# Patient Record
Sex: Female | Born: 1995 | Race: Black or African American | Hispanic: No | Marital: Single | Smoking: Former smoker
Health system: Southern US, Community
[De-identification: ages and names within clinical notes are randomized; demographics above are authoritative.]

## PROBLEM LIST (undated history)

## (undated) ENCOUNTER — Inpatient Hospital Stay (HOSPITAL_COMMUNITY): Payer: Self-pay

## (undated) DIAGNOSIS — F32A Depression, unspecified: Secondary | ICD-10-CM

## (undated) DIAGNOSIS — F419 Anxiety disorder, unspecified: Secondary | ICD-10-CM

## (undated) DIAGNOSIS — B009 Herpesviral infection, unspecified: Secondary | ICD-10-CM

## (undated) DIAGNOSIS — Z789 Other specified health status: Secondary | ICD-10-CM

## (undated) DIAGNOSIS — A749 Chlamydial infection, unspecified: Secondary | ICD-10-CM

## (undated) HISTORY — PX: MOUTH SURGERY: SHX715

---
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
## 2017-03-16 ENCOUNTER — Encounter (HOSPITAL_COMMUNITY): Payer: Self-pay

## 2017-03-16 ENCOUNTER — Emergency Department (HOSPITAL_COMMUNITY)
Admission: EM | Admit: 2017-03-16 | Discharge: 2017-03-16 | Disposition: A | Discharge status: Home / Self Care | Payer: Self-pay | Attending: Emergency Medicine | Admitting: Emergency Medicine

## 2017-03-16 DIAGNOSIS — Z5321 Procedure and treatment not carried out due to patient leaving prior to being seen by health care provider: Secondary | ICD-10-CM | POA: Insufficient documentation

## 2017-03-16 DIAGNOSIS — N938 Other specified abnormal uterine and vaginal bleeding: Secondary | ICD-10-CM | POA: Insufficient documentation

## 2017-03-16 NOTE — ED Notes (Addendum)
No answer when called for lab x2.

## 2017-03-16 NOTE — ED Triage Notes (Signed)
Patient reports of positive pregnancy test and started having vaginal spotting last night. Also reports of RLQ pain. LMP 01/29/17.

## 2017-03-17 ENCOUNTER — Emergency Department (HOSPITAL_COMMUNITY)
Admission: EM | Admit: 2017-03-17 | Discharge: 2017-03-17 | Discharge status: Against Medical Advice | Payer: Self-pay | Attending: Emergency Medicine | Admitting: Emergency Medicine

## 2017-03-17 ENCOUNTER — Inpatient Hospital Stay (HOSPITAL_COMMUNITY)
Admission: AD | Admit: 2017-03-17 | Discharge: 2017-03-17 | Disposition: A | Discharge status: Home / Self Care | Payer: Self-pay | Source: Ambulatory Visit | Attending: Family Medicine | Admitting: Family Medicine

## 2017-03-17 ENCOUNTER — Encounter (HOSPITAL_COMMUNITY): Payer: Self-pay

## 2017-03-17 ENCOUNTER — Inpatient Hospital Stay (HOSPITAL_COMMUNITY): Payer: Self-pay

## 2017-03-17 ENCOUNTER — Encounter (HOSPITAL_COMMUNITY): Payer: Self-pay | Admitting: *Deleted

## 2017-03-17 DIAGNOSIS — O26892 Other specified pregnancy related conditions, second trimester: Secondary | ICD-10-CM | POA: Insufficient documentation

## 2017-03-17 DIAGNOSIS — R1084 Generalized abdominal pain: Secondary | ICD-10-CM | POA: Insufficient documentation

## 2017-03-17 DIAGNOSIS — Z880 Allergy status to penicillin: Secondary | ICD-10-CM | POA: Insufficient documentation

## 2017-03-17 DIAGNOSIS — R102 Pelvic and perineal pain: Secondary | ICD-10-CM | POA: Insufficient documentation

## 2017-03-17 DIAGNOSIS — O26891 Other specified pregnancy related conditions, first trimester: Secondary | ICD-10-CM

## 2017-03-17 DIAGNOSIS — O26899 Other specified pregnancy related conditions, unspecified trimester: Secondary | ICD-10-CM

## 2017-03-17 DIAGNOSIS — N854 Malposition of uterus: Secondary | ICD-10-CM | POA: Insufficient documentation

## 2017-03-17 DIAGNOSIS — Z87891 Personal history of nicotine dependence: Secondary | ICD-10-CM | POA: Insufficient documentation

## 2017-03-17 DIAGNOSIS — Z9889 Other specified postprocedural states: Secondary | ICD-10-CM | POA: Insufficient documentation

## 2017-03-17 DIAGNOSIS — Z3A01 Less than 8 weeks gestation of pregnancy: Secondary | ICD-10-CM | POA: Insufficient documentation

## 2017-03-17 DIAGNOSIS — Z5321 Procedure and treatment not carried out due to patient leaving prior to being seen by health care provider: Secondary | ICD-10-CM | POA: Insufficient documentation

## 2017-03-17 DIAGNOSIS — O98811 Other maternal infectious and parasitic diseases complicating pregnancy, first trimester: Secondary | ICD-10-CM | POA: Insufficient documentation

## 2017-03-17 LAB — CBC
HEMATOCRIT: 39.7 % (ref 36.0–46.0)
HEMOGLOBIN: 13.4 g/dL (ref 12.0–15.0)
MCH: 30.9 pg (ref 26.0–34.0)
MCHC: 33.8 g/dL (ref 30.0–36.0)
MCV: 91.5 fL (ref 78.0–100.0)
Platelets: 336 10*3/uL (ref 150–400)
RBC: 4.34 MIL/uL (ref 3.87–5.11)
RDW: 12.5 % (ref 11.5–15.5)
WBC: 9.7 10*3/uL (ref 4.0–10.5)

## 2017-03-17 LAB — COMPREHENSIVE METABOLIC PANEL
ALBUMIN: 4 g/dL (ref 3.5–5.0)
ALT: 14 U/L (ref 14–54)
AST: 20 U/L (ref 15–41)
Alkaline Phosphatase: 40 U/L (ref 38–126)
Anion gap: 7 (ref 5–15)
BILIRUBIN TOTAL: 1.2 mg/dL (ref 0.3–1.2)
BUN: <5 mg/dL — ABNORMAL LOW (ref 6–20)
CHLORIDE: 108 mmol/L (ref 101–111)
CO2: 21 mmol/L — ABNORMAL LOW (ref 22–32)
Calcium: 9.4 mg/dL (ref 8.9–10.3)
Creatinine, Ser: 0.77 mg/dL (ref 0.44–1.00)
GFR calc Af Amer: >60 mL/min (ref >60)
Glucose, Bld: 75 mg/dL (ref 65–99)
POTASSIUM: 3.5 mmol/L (ref 3.5–5.1)
Sodium: 136 mmol/L (ref 135–145)
TOTAL PROTEIN: 7 g/dL (ref 6.5–8.1)

## 2017-03-17 LAB — URINALYSIS, ROUTINE W REFLEX MICROSCOPIC
Bilirubin Urine: NEGATIVE
Glucose, UA: NEGATIVE mg/dL
Hgb urine dipstick: NEGATIVE
Ketones, ur: NEGATIVE mg/dL
LEUKOCYTES UA: NEGATIVE
NITRITE: NEGATIVE
PH: 6 (ref 5.0–8.0)
Protein, ur: NEGATIVE mg/dL
SPECIFIC GRAVITY, URINE: 1.019 (ref 1.005–1.030)

## 2017-03-17 LAB — WET PREP, GENITAL
CLUE CELLS WET PREP: NONE SEEN
Sperm: NONE SEEN
TRICH WET PREP: NONE SEEN
YEAST WET PREP: NONE SEEN

## 2017-03-17 LAB — LIPASE, BLOOD: LIPASE: 35 U/L (ref 11–51)

## 2017-03-17 LAB — HCG, QUANTITATIVE, PREGNANCY: HCG, BETA CHAIN, QUANT, S: 38752 m[IU]/mL — AB (ref <5)

## 2017-03-17 LAB — I-STAT BETA HCG BLOOD, ED (MC, WL, AP ONLY): I-stat hCG, quantitative: >2000 m[IU]/mL — ABNORMAL HIGH (ref <5)

## 2017-03-17 MED ORDER — PRENATAL VITAMINS 0.8 MG PO TABS: 1.0000 | ORAL_TABLET | Freq: Every day | ORAL | 12 refills | Status: DC

## 2017-03-17 NOTE — ED Triage Notes (Signed)
Per Pt, Pt reports RLQ pain and spotting that started yesterday. Pt complains that she is pregnant, but unknown how many weeks. Denies other vaginal discharge or urinary symptoms.

## 2017-03-17 NOTE — MAU Note (Signed)
Pt reports she has been having right side pain and spotting since last pm.

## 2017-03-17 NOTE — MAU Provider Note (Signed)
Chief Complaint: Abdominal Pain and Vaginal Bleeding   First Provider Initiated Contact with Patient 03/17/17 1354        SUBJECTIVE HPI: Tina Beck is a 21 y.o. G2P0010 at 662w6d  who presents to maternity admissions reporting pain in the right lower quadrant.  Started yesterday. States had spotting yesterday but none today.  Was at [facility] ED and had blood drawn, but left AMA because the wait was too long. . She denies vaginal itching/burning, urinary symptoms, h/a, dizziness, n/v, or fever/chills.     Abdominal Pain  This is a new problem. The current episode started yesterday. The onset quality is gradual. The problem occurs intermittently. The problem has been unchanged. The pain is located in the RLQ. The pain is mild. The quality of the pain is cramping. The abdominal pain does not radiate. Pertinent negatives include no constipation, diarrhea, fever, headaches, myalgias, nausea or vomiting. Nothing aggravates the pain. The pain is relieved by nothing. She has tried nothing for the symptoms.  Vaginal Bleeding  The patient's primary symptoms include pelvic pain and vaginal discharge. The patient's pertinent negatives include no genital itching, genital lesions, genital odor or vaginal bleeding. This is a new problem. The current episode started yesterday. The problem occurs intermittently. The problem has been resolved. The pain is mild. The problem affects the right side. She is pregnant. Associated symptoms include abdominal pain. Pertinent negatives include no constipation, diarrhea, fever, headaches, nausea or vomiting.   RN note: Pt reports she has been having right side pain and spotting since last pm.  Per Pt, Pt reports RLQ pain and spotting that started yesterday. Pt complains that she is pregnant, but unknown how many weeks. Denies other vaginal discharge or urinary symptoms.   History reviewed. No pertinent past medical history. Past Surgical History:  Procedure Laterality  Date  . MOUTH SURGERY    . OPEN REDUCTION INTERNAL FIXATION (ORIF) METACARPAL Right 03/23/2016   Procedure: OPEN REDUCTION INTERNAL FIXATION OF RIGHT 5TH METACARPAL;  Surgeon: Dairl PonderMatthew Weingold, MD;  Location: [facility];  Service: Orthopedics;  Laterality: Right;   Social History   Social History  . Marital status: Single    Spouse name: N/A  . Number of children: N/A  . Years of education: N/A   Occupational History  . Not on file.   Social History Main Topics  . Smoking status: Former Games developermoker  . Smokeless tobacco: Never Used  . Alcohol use No  . Drug use: No  . Sexual activity: Not Currently   Other Topics Concern  . Not on file   Social History Narrative  . No narrative on file   No current facility-administered medications on file prior to encounter.    No current outpatient prescriptions on file prior to encounter.   Allergies  Allergen Reactions  . Penicillins Hives and Itching    Has patient had a PCN reaction causing immediate rash, facial/tongue/throat swelling, SOB or lightheadedness with hypotension: No Has patient had a PCN reaction causing severe rash involving mucus membranes or skin necrosis: No Has patient had a PCN reaction that required hospitalization: No Has patient had a PCN reaction occurring within the last 10 years: Yes If all of the above answers are "NO", then may proceed with Cephalosporin use.     I have reviewed patient's Past Medical Hx, Surgical Hx, Family Hx, Social Hx, medications and allergies.   ROS:  Review of Systems  Constitutional: Negative for fever.  Gastrointestinal: Positive for abdominal pain.  Negative for constipation, diarrhea, nausea and vomiting.  Genitourinary: Positive for pelvic pain, vaginal bleeding and vaginal discharge.  Musculoskeletal: Negative for myalgias.  Neurological: Negative for headaches.   Review of Systems  Other systems negative   Physical Exam  Physical Exam Patient Vitals for  the past 24 hrs:  BP Temp Pulse Resp SpO2  03/17/17 1316 119/69 98.2 F (36.8 C) 61 16 100 %   Constitutional: Well-developed, well-nourished female in no acute distress.  Cardiovascular: normal rate Respiratory: normal effort GI: Abd soft, non-tender. Pos BS x 4 MS: Extremities nontender, no edema, normal ROM Neurologic: Alert and oriented x 4.  GU: Neg CVAT.  PELVIC EXAM: Retained tampon was found in vagina                           Cervix long and closed                            Uterus retroverted, firm, nontender                           Adnexa nontender bilaterally   LAB RESULTS Results for orders placed or performed during the hospital encounter of 03/17/17 (from the past 24 hour(s))  Wet prep, genital     Status: Abnormal   Collection Time: 03/17/17  1:52 PM  Result Value Ref Range   Yeast Wet Prep HPF POC NONE SEEN NONE SEEN   Trich, Wet Prep NONE SEEN NONE SEEN   Clue Cells Wet Prep HPF POC NONE SEEN NONE SEEN   WBC, Wet Prep HPF POC FEW (A) NONE SEEN   Sperm NONE SEEN     Ref. Range 03/17/2017 10:40  HCG, Beta Chain, Quant, S Latest Ref Range: <5 mIU/mL 38,752 (H)      IMAGING US Ob Comp Less 14 Wks  Result Date: 03/17/2017 CLINICAL DATA:  Pelvic pain affecting first trimester of pregnancy RIGHT-sided pain with spotting since last night EXAM: OBSTETRIC <14 WK [place] AND TRANSVAGINAL OB US TECHNIQUE: Both transabdominal and transvaginal ultrasound examinations were performed for complete evaluation of the gestation as well as the maternal uterus, adnexal regions, and pelvic cul-de-sac. Transvaginal technique was performed to assess early pregnancy. COMPARISON:  None for this gestation FINDINGS: Intrauterine gestational sac: Present Yolk sac:  Present Embryo:  Present Cardiac Activity: Present Heart Rate: 99  bpm CRL:  3.1  mm   5 w   6 d                  [place] EDC: 11/11/2017 Subchorionic hemorrhage:  None identified Maternal uterus/adnexae: Retroverted uterus. LEFT ovary  normal size and morphology 2.8 x 1.5 x 1.2 cm. RIGHT ovary normal size and morphology, 2.4 x 1.2 x 1.3 cm. No adnexal masses or free pelvic fluid. IMPRESSION: Single live intrauterine gestation at 5 weeks 6 days EGA by crown-rump length. No acute abnormalities. Electronically Signed   By: Ulyses Southward M.D.   On: 03/17/2017 16:12   US Ob Transvaginal  Result Date: 03/17/2017 CLINICAL DATA:  Pelvic pain affecting first trimester of pregnancy RIGHT-sided pain with spotting since last night EXAM: OBSTETRIC <14 WK [place] AND TRANSVAGINAL OB US TECHNIQUE: Both transabdominal and transvaginal ultrasound examinations were performed for complete evaluation of the gestation as well as the maternal uterus, adnexal regions, and pelvic cul-de-sac. Transvaginal technique was performed to assess early pregnancy.  COMPARISON:  None for this gestation FINDINGS: Intrauterine gestational sac: Present Yolk sac:  Present Embryo:  Present Cardiac Activity: Present Heart Rate: 99  bpm CRL:  3.1  mm   5 w   6 d                  [place] EDC: 11/11/2017 Subchorionic hemorrhage:  None identified Maternal uterus/adnexae: Retroverted uterus. LEFT ovary normal size and morphology 2.8 x 1.5 x 1.2 cm. RIGHT ovary normal size and morphology, 2.4 x 1.2 x 1.3 cm. No adnexal masses or free pelvic fluid. IMPRESSION: Single live intrauterine gestation at 5 weeks 6 days EGA by crown-rump length. No acute abnormalities. Electronically Signed   By: Ulyses Southward M.D.   On: 03/17/2017 16:12   MAU Management/MDM: Ordered usual first trimester r/o ectopic labs.   Pelvic exam and cultures done Will check baseline Ultrasound to rule out ectopic.  This bleeding/pain can represent a normal pregnancy with bleeding, spontaneous abortion or even an ectopic which can be life-threatening.  The process as listed above helps to determine which of these is present. Reviewed [place] results Viable [redacted]w[redacted]d single IUP Recommend start prenatal care   ASSESSMENT 1. Pelvic pain  affecting pregnancy   2.    Live single intrauterine pregnancy at [redacted]w[redacted]d  PLAN Discharge home Recommend start prenatal care  Pt stable at time of discharge. Encouraged to return here or to other Urgent Care/ED if she develops worsening of symptoms, increase in pain, fever, or other concerning symptoms.    Wynelle Bourgeois CNM, MSN Certified Nurse-Midwife 03/17/2017  2:52 PM

## 2017-03-17 NOTE — Discharge Instructions (Signed)
First Trimester of Pregnancy The first trimester of pregnancy is from week 1 until the end of week 13 (months 1 through 3). A week after a sperm fertilizes an egg, the egg will implant on the wall of the uterus. This embryo will begin to develop into a baby. Genes from you and your partner will form the baby. The female genes will determine whether the baby will be a boy or a girl. At 6-8 weeks, the eyes and face will be formed, and the heartbeat can be seen on ultrasound. At the end of 12 weeks, all the baby's organs will be formed. Now that you are pregnant, you will want to do everything you can to have a healthy baby. Two of the most important things are to get good prenatal care and to follow your health care provider's instructions. Prenatal care is all the medical care you receive before the baby's birth. This care will help prevent, find, and treat any problems during the pregnancy and childbirth. Body changes during your first trimester Your body goes through many changes during pregnancy. The changes vary from woman to woman.  You may gain or lose a couple of pounds at first.  You may feel sick to your stomach (nauseous) and you may throw up (vomit). If the vomiting is uncontrollable, call your health care provider.  You may tire easily.  You may develop headaches that can be relieved by medicines. All medicines should be approved by your health care provider.  You may urinate more often. Painful urination may mean you have a bladder infection.  You may develop heartburn as a result of your pregnancy.  You may develop constipation because certain hormones are causing the muscles that push stool through your intestines to slow down.  You may develop hemorrhoids or swollen veins (varicose veins).  Your breasts may begin to grow larger and become tender. Your nipples may stick out more, and the tissue that surrounds them (areola) may become darker.  Your gums may bleed and may be  sensitive to brushing and flossing.  Dark spots or blotches (chloasma, mask of pregnancy) may develop on your face. This will likely fade after the baby is born.  Your menstrual periods will stop.  You may have a loss of appetite.  You may develop cravings for certain kinds of food.  You may have changes in your emotions from day to day, such as being excited to be pregnant or being concerned that something may go wrong with the pregnancy and baby.  You may have more vivid and strange dreams.  You may have changes in your hair. These can include thickening of your hair, rapid growth, and changes in texture. Some women also have hair loss during or after pregnancy, or hair that feels dry or thin. Your hair will most likely return to normal after your baby is born.  What to expect at prenatal visits During a routine prenatal visit:  You will be weighed to make sure you and the baby are growing normally.  Your blood pressure will be taken.  Your abdomen will be measured to track your baby's growth.  The fetal heartbeat will be listened to between weeks 10 and 14 of your pregnancy.  Test results from any previous visits will be discussed.  Your health care provider may ask you:  How you are feeling.  If you are feeling the baby move.  If you have had any abnormal symptoms, such as leaking fluid, bleeding, severe headaches,   or abdominal cramping.  If you are using any tobacco products, including cigarettes, chewing tobacco, and electronic cigarettes.  If you have any questions.  Other tests that may be performed during your first trimester include:  Blood tests to find your blood type and to check for the presence of any previous infections. The tests will also be used to check for low iron levels (anemia) and protein on red blood cells (Rh antibodies). Depending on your risk factors, or if you previously had diabetes during pregnancy, you may have tests to check for high blood  sugar that affects pregnant women (gestational diabetes).  Urine tests to check for infections, diabetes, or protein in the urine.  An ultrasound to confirm the proper growth and development of the baby.  Fetal screens for spinal cord problems (spina bifida) and Down syndrome.  HIV (human immunodeficiency virus) testing. Routine prenatal testing includes screening for HIV, unless you choose not to have this test.  You may need other tests to make sure you and the baby are doing well.  Follow these instructions at home: Medicines  Follow your health care provider's instructions regarding medicine use. Specific medicines may be either safe or unsafe to take during pregnancy.  Take a prenatal vitamin that contains at least 600 micrograms (mcg) of folic acid.  If you develop constipation, try taking a stool softener if your health care provider approves. Eating and drinking  Eat a balanced diet that includes fresh fruits and vegetables, whole grains, good sources of protein such as meat, eggs, or tofu, and low-fat dairy. Your health care provider will help you determine the amount of weight gain that is right for you.  Avoid raw meat and uncooked cheese. These carry germs that can cause birth defects in the baby.  Eating four or five small meals rather than three large meals a day may help relieve nausea and vomiting. If you start to feel nauseous, eating a few soda crackers can be helpful. Drinking liquids between meals, instead of during meals, also seems to help ease nausea and vomiting.  Limit foods that are high in fat and processed sugars, such as fried and sweet foods.  To prevent constipation: ? Eat foods that are high in fiber, such as fresh fruits and vegetables, whole grains, and beans. ? Drink enough fluid to keep your urine clear or pale yellow. Activity  Exercise only as directed by your health care provider. Most women can continue their usual exercise routine during  pregnancy. Try to exercise for 30 minutes at least 5 days a week. Exercising will help you: ? Control your weight. ? Stay in shape. ? Be prepared for labor and delivery.  Experiencing pain or cramping in the lower abdomen or lower back is a good sign that you should stop exercising. Check with your health care provider before continuing with normal exercises.  Try to avoid standing for long periods of time. Move your legs often if you must stand in one place for a long time.  Avoid heavy lifting.  Wear low-heeled shoes and practice good posture.  You may continue to have sex unless your health care provider tells you not to. Relieving pain and discomfort  Wear a good support bra to relieve breast tenderness.  Take warm sitz baths to soothe any pain or discomfort caused by hemorrhoids. Use hemorrhoid cream if your health care provider approves.  Rest with your legs elevated if you have leg cramps or low back pain.  If you develop   varicose veins in your legs, wear support hose. Elevate your feet for 15 minutes, 3-4 times a day. Limit salt in your diet. Prenatal care  Schedule your prenatal visits by the twelfth week of pregnancy. They are usually scheduled monthly at first, then more often in the last 2 months before delivery.  Write down your questions. Take them to your prenatal visits.  Keep all your prenatal visits as told by your health care provider. This is important. Safety  Wear your seat belt at all times when driving.  Make a list of emergency phone numbers, including numbers for family, friends, the hospital, and police and fire departments. General instructions  Ask your health care provider for a referral to a local prenatal education class. Begin classes no later than the beginning of month 6 of your pregnancy.  Ask for help if you have counseling or nutritional needs during pregnancy. Your health care provider can offer advice or refer you to specialists for help  with various needs.  Do not use hot tubs, steam rooms, or saunas.  Do not douche or use tampons or scented sanitary pads.  Do not cross your legs for long periods of time.  Avoid cat litter boxes and soil used by cats. These carry germs that can cause birth defects in the baby and possibly loss of the fetus by miscarriage or stillbirth.  Avoid all smoking, herbs, alcohol, and medicines not prescribed by your health care provider. Chemicals in these products affect the formation and growth of the baby.  Do not use any products that contain nicotine or tobacco, such as cigarettes and e-cigarettes. If you need help quitting, ask your health care provider. You may receive counseling support and other resources to help you quit.  Schedule a dentist appointment. At home, brush your teeth with a soft toothbrush and be gentle when you floss. Contact a health care provider if:  You have dizziness.  You have mild pelvic cramps, pelvic pressure, or nagging pain in the abdominal area.  You have persistent nausea, vomiting, or diarrhea.  You have a bad smelling vaginal discharge.  You have pain when you urinate.  You notice increased swelling in your face, hands, legs, or ankles.  You are exposed to fifth disease or chickenpox.  You are exposed to German measles (rubella) and have never had it. Get help right away if:  You have a fever.  You are leaking fluid from your vagina.  You have spotting or bleeding from your vagina.  You have severe abdominal cramping or pain.  You have rapid weight gain or loss.  You vomit blood or material that looks like coffee grounds.  You develop a severe headache.  You have shortness of breath.  You have any kind of trauma, such as from a fall or a car accident. Summary  The first trimester of pregnancy is from week 1 until the end of week 13 (months 1 through 3).  Your body goes through many changes during pregnancy. The changes vary from  woman to woman.  You will have routine prenatal visits. During those visits, your health care provider will examine you, discuss any test results you may have, and talk with you about how you are feeling. This information is not intended to replace advice given to you by your health care provider. Make sure you discuss any questions you have with your health care provider. Document Released: 06/16/2001 Document Revised: 06/03/2016 Document Reviewed: 06/03/2016 Elsevier Interactive Patient Education  2017 [employer]  

## 2017-03-18 LAB — GC/CHLAMYDIA PROBE AMP (~~LOC~~) NOT AT ARMC
Chlamydia: POSITIVE — AB
NEISSERIA GONORRHEA: NEGATIVE

## 2017-03-19 ENCOUNTER — Telehealth: Payer: Self-pay | Admitting: Student

## 2017-03-19 DIAGNOSIS — A749 Chlamydial infection, unspecified: Secondary | ICD-10-CM

## 2017-03-19 MED ORDER — AZITHROMYCIN 500 MG PO TABS: 1000.0000 mg | ORAL_TABLET | Freq: Once | ORAL | 0 refills | Status: AC

## 2017-03-19 NOTE — Telephone Encounter (Addendum)
Idamay K Vullo tested positive for  Chlamydia. Patient was called by RN and allergies and pharmacy confirmed. Rx sent to pharmacy of choice.   Judeth Horn, NP 03/19/2017 1:16 PM       ----- Message from Kathe Becton, RN sent at 03/19/2017  1:12 PM EDT ----- This patient tested positive for "Chlamydia","  She ","is allergic to Penicillin"  I have informed the patient of her results and confirmed her pharmacy is correct in her chart. Please send Rx.   Thank you,   Kathe Becton, RN   Results faxed to [facility] Department.

## 2017-03-22 ENCOUNTER — Encounter: Payer: Self-pay | Admitting: Advanced Practice Midwife

## 2017-03-22 DIAGNOSIS — A749 Chlamydial infection, unspecified: Secondary | ICD-10-CM | POA: Insufficient documentation

## 2017-03-31 ENCOUNTER — Encounter: Payer: Self-pay | Admitting: Women's Health

## 2018-02-03 ENCOUNTER — Encounter (HOSPITAL_COMMUNITY): Payer: Self-pay

## 2018-04-12 ENCOUNTER — Emergency Department (HOSPITAL_COMMUNITY): Payer: Medicaid - Out of State

## 2018-04-12 ENCOUNTER — Encounter (HOSPITAL_COMMUNITY): Payer: Self-pay | Admitting: Emergency Medicine

## 2018-04-12 ENCOUNTER — Other Ambulatory Visit: Payer: Self-pay

## 2018-04-12 ENCOUNTER — Emergency Department (HOSPITAL_COMMUNITY)
Admission: EM | Admit: 2018-04-12 | Discharge: 2018-04-12 | Disposition: A | Discharge status: Home / Self Care | Payer: Medicaid - Out of State | Attending: Emergency Medicine | Admitting: Emergency Medicine

## 2018-04-12 DIAGNOSIS — Z87891 Personal history of nicotine dependence: Secondary | ICD-10-CM | POA: Insufficient documentation

## 2018-04-12 DIAGNOSIS — Z79899 Other long term (current) drug therapy: Secondary | ICD-10-CM | POA: Diagnosis not present

## 2018-04-12 DIAGNOSIS — N72 Inflammatory disease of cervix uteri: Secondary | ICD-10-CM | POA: Diagnosis not present

## 2018-04-12 DIAGNOSIS — O219 Vomiting of pregnancy, unspecified: Secondary | ICD-10-CM | POA: Diagnosis present

## 2018-04-12 DIAGNOSIS — Z3A01 Less than 8 weeks gestation of pregnancy: Secondary | ICD-10-CM | POA: Diagnosis not present

## 2018-04-12 DIAGNOSIS — B373 Candidiasis of vulva and vagina: Secondary | ICD-10-CM | POA: Insufficient documentation

## 2018-04-12 DIAGNOSIS — O23511 Infections of cervix in pregnancy, first trimester: Secondary | ICD-10-CM | POA: Insufficient documentation

## 2018-04-12 DIAGNOSIS — B3731 Acute candidiasis of vulva and vagina: Secondary | ICD-10-CM

## 2018-04-12 DIAGNOSIS — A5901 Trichomonal vulvovaginitis: Secondary | ICD-10-CM

## 2018-04-12 DIAGNOSIS — E876 Hypokalemia: Secondary | ICD-10-CM | POA: Insufficient documentation

## 2018-04-12 LAB — BASIC METABOLIC PANEL
ANION GAP: 12 (ref 5–15)
BUN: 9 mg/dL (ref 6–20)
CALCIUM: 9.1 mg/dL (ref 8.9–10.3)
CO2: 19 mmol/L — ABNORMAL LOW (ref 22–32)
CREATININE: 0.68 mg/dL (ref 0.44–1.00)
Chloride: 104 mmol/L (ref 98–111)
Glucose, Bld: 75 mg/dL (ref 70–99)
Potassium: 3 mmol/L — ABNORMAL LOW (ref 3.5–5.1)
Sodium: 135 mmol/L (ref 135–145)

## 2018-04-12 LAB — URINALYSIS, ROUTINE W REFLEX MICROSCOPIC
BILIRUBIN URINE: NEGATIVE
Glucose, UA: NEGATIVE mg/dL
Hgb urine dipstick: NEGATIVE
Ketones, ur: 80 mg/dL — AB
NITRITE: NEGATIVE
PROTEIN: 100 mg/dL — AB
SPECIFIC GRAVITY, URINE: 1.027 (ref 1.005–1.030)
pH: 5 (ref 5.0–8.0)

## 2018-04-12 LAB — CBC WITH DIFFERENTIAL/PLATELET
Abs Immature Granulocytes: 0.02 10*3/uL (ref 0.00–0.07)
BASOS ABS: 0 10*3/uL (ref 0.0–0.1)
Basophils Relative: 0 %
EOS ABS: 0 10*3/uL (ref 0.0–0.5)
EOS PCT: 0 %
HCT: 41.8 % (ref 36.0–46.0)
Hemoglobin: 13.5 g/dL (ref 12.0–15.0)
IMMATURE GRANULOCYTES: 0 %
LYMPHS ABS: 2 10*3/uL (ref 0.7–4.0)
Lymphocytes Relative: 20 %
MCH: 30.3 pg (ref 26.0–34.0)
MCHC: 32.3 g/dL (ref 30.0–36.0)
MCV: 93.9 fL (ref 80.0–100.0)
MONOS PCT: 7 %
Monocytes Absolute: 0.8 10*3/uL (ref 0.1–1.0)
NEUTROS PCT: 73 %
NRBC: 0 % (ref 0.0–0.2)
Neutro Abs: 7.6 10*3/uL (ref 1.7–7.7)
PLATELETS: 384 10*3/uL (ref 150–400)
RBC: 4.45 MIL/uL (ref 3.87–5.11)
RDW: 13.1 % (ref 11.5–15.5)
WBC: 10.4 10*3/uL (ref 4.0–10.5)

## 2018-04-12 LAB — WET PREP, GENITAL
CLUE CELLS WET PREP: NONE SEEN
Sperm: NONE SEEN

## 2018-04-12 LAB — HCG, QUANTITATIVE, PREGNANCY: HCG, BETA CHAIN, QUANT, S: 56394 m[IU]/mL — AB (ref <5)

## 2018-04-12 MED ORDER — ONDANSETRON 4 MG PO TBDP: 4.0000 mg | ORAL_TABLET | Freq: Three times a day (TID) | ORAL | 0 refills | Status: DC | PRN

## 2018-04-12 MED ORDER — METRONIDAZOLE 500 MG PO TABS
2000.0000 mg | ORAL_TABLET | Freq: Once | ORAL | Status: DC
  Filled 2018-04-12: qty 4

## 2018-04-12 MED ORDER — AZITHROMYCIN 250 MG PO TABS
500.0000 mg | ORAL_TABLET | Freq: Once | ORAL | Status: DC
  Filled 2018-04-12: qty 2

## 2018-04-12 MED ORDER — MICONAZOLE NITRATE 1200 & 2 MG & % VA KIT: 1.0000 | PACK | Freq: Once | VAGINAL | 0 refills | Status: AC

## 2018-04-12 MED ORDER — PRENATAL VITAMINS 0.8 MG PO TABS: 1.0000 | ORAL_TABLET | Freq: Every day | ORAL | 0 refills | Status: AC

## 2018-04-12 MED ORDER — SODIUM CHLORIDE 0.9 % IV BOLUS
1000.0000 mL | Freq: Once | INTRAVENOUS | Status: AC
  Administered 2018-04-12: 1000 mL via INTRAVENOUS

## 2018-04-12 MED ORDER — LIDOCAINE HCL (PF) 1 % IJ SOLN
INTRAMUSCULAR | Status: AC
  Filled 2018-04-12: qty 2

## 2018-04-12 MED ORDER — CEFTRIAXONE SODIUM 250 MG IJ SOLR
250.0000 mg | Freq: Once | INTRAMUSCULAR | Status: DC
  Filled 2018-04-12: qty 250

## 2018-04-12 MED ORDER — ONDANSETRON HCL 4 MG/2ML IJ SOLN
4.0000 mg | Freq: Once | INTRAMUSCULAR | Status: AC
  Administered 2018-04-12: 4 mg via INTRAVENOUS
  Filled 2018-04-12: qty 2

## 2018-04-12 NOTE — ED Notes (Signed)
Pt pulled out her IV, Got pt to wait and sign out, Would not wait for EDP to come to room to discuss her leaving.

## 2018-04-12 NOTE — ED Triage Notes (Signed)
Pt is [redacted] week pregnant.  States having n/v 3 days and unable to eat.

## 2018-04-12 NOTE — ED Notes (Signed)
Brought meds in room for Patient, patient ask " will this medication cause and miscarriage/"  Told pt medication was treatment of her trich. Pt states " I am refusing the medication" Advised pt to take medication so the baby would not have any complications. " I want to have a miscarriage, I will go get an abortion, I am not taking taking that medicine." Pt signed out AMA

## 2018-04-12 NOTE — ED Provider Notes (Signed)
[facility] EMERGENCY DEPARTMENT Provider Note   CSN: [id number] Arrival date & time: 04/12/18  1506     History   Chief Complaint Chief Complaint  Patient presents with  . Emesis During Pregnancy    Tina Beck is a 22 y.o. female.  Pt presents to the ED today with vomiting.  She thinks she is about [redacted] weeks pregnant.  She took 2 pregnancy tests that were + at home.  She has not yet seen a doctor.  She has had some spotting and some lower abdominal pain.  The pt said she has not eaten in 3 days.  She denies f/c.  She said the same thing happened when she was pregnant with her last pregnancy.  Baby was born in May.     History reviewed. No pertinent past medical history.  Patient Active Problem List   Diagnosis Date Noted  . Chlamydia 03/22/2017  . Missed abortion 10/14/2015    Past Surgical History:  Procedure Laterality Date  . MOUTH SURGERY    . OPEN REDUCTION INTERNAL FIXATION (ORIF) METACARPAL Right 03/23/2016   Procedure: OPEN REDUCTION INTERNAL FIXATION OF RIGHT 5TH METACARPAL;  Surgeon: Charlotte Crumb, MD;  Location: [place];  Service: Orthopedics;  Laterality: Right;     OB History    Gravida  3   Para      Term      Preterm      AB  1   Living        SAB  1   TAB      Ectopic      Multiple      Live Births               Home Medications    Prior to Admission medications   Medication Sig Start Date End Date Taking? Authorizing Provider  miconazole (MONISTAT 1 COMBO PACK) kit Place 1 each vaginally once for 1 dose. 04/12/18 04/12/18  Isla Pence, MD  ondansetron (ZOFRAN ODT) 4 MG disintegrating tablet Take 1 tablet (4 mg total) by mouth every 8 (eight) hours as needed. 04/12/18   Isla Pence, MD  Prenatal Multivit-Min-Fe-FA (PRENATAL VITAMINS) 0.8 MG tablet Take 1 tablet by mouth daily. 04/12/18   Isla Pence, MD    Family History No family history on file.  Social History Social History    Tobacco Use  . Smoking status: Former Research scientist (life sciences)  . Smokeless tobacco: Never Used  Substance Use Topics  . Alcohol use: No  . Drug use: No     Allergies   Penicillins   Review of Systems Review of Systems  Gastrointestinal: Positive for abdominal pain, nausea and vomiting.  Genitourinary: Positive for vaginal bleeding.  All other systems reviewed and are negative.    Physical Exam Updated Vital Signs BP 120/85 (BP Location: Right Arm)   Pulse 71   Temp 98 F (36.7 C) (Oral)   Resp 18   Ht 6' (1.829 m)   Wt 72.6 kg   LMP 03/06/2018   SpO2 99%   BMI 21.70 kg/m   Physical Exam  Constitutional: She is oriented to person, place, and time. She appears well-developed and well-nourished.  HENT:  Head: Normocephalic and atraumatic.  Right Ear: External ear normal.  Left Ear: External ear normal.  Nose: Nose normal.  Mouth/Throat: Mucous membranes are dry.  Eyes: Pupils are equal, round, and reactive to light. Conjunctivae and EOM are normal.  Neck: Normal range of motion. Neck supple.  Cardiovascular: Normal rate, regular rhythm, normal heart sounds and intact distal pulses.  Pulmonary/Chest: Effort normal and breath sounds normal.  Abdominal: Soft. Bowel sounds are normal. There is tenderness in the suprapubic area.  Genitourinary: Vagina normal and uterus normal. Cervix exhibits discharge and friability.  Musculoskeletal: Normal range of motion.  Neurological: She is alert and oriented to person, place, and time.  Skin: Skin is warm. Capillary refill takes less than 2 seconds.  Psychiatric: She has a normal mood and affect. Her behavior is normal. Judgment and thought content normal.  Nursing note and vitals reviewed.    ED Treatments / Results  Labs (all labs ordered are listed, but only abnormal results are displayed) Labs Reviewed  WET PREP, GENITAL - Abnormal; Notable for the following components:      Result Value   Yeast Wet Prep HPF POC PRESENT (*)     Trich, Wet Prep PRESENT (*)    WBC, Wet Prep HPF POC MANY (*)    All other components within normal limits  BASIC METABOLIC PANEL - Abnormal; Notable for the following components:   Potassium 3.0 (*)    CO2 19 (*)    All other components within normal limits  URINALYSIS, ROUTINE W REFLEX MICROSCOPIC - Abnormal; Notable for the following components:   APPearance CLOUDY (*)    Ketones, ur 80 (*)    Protein, ur 100 (*)    Leukocytes, UA LARGE (*)    Bacteria, UA FEW (*)    Trichomonas, UA PRESENT (*)    All other components within normal limits  HCG, QUANTITATIVE, PREGNANCY - Abnormal; Notable for the following components:   hCG, Beta Chain, Quant, S 56,394 (*)    All other components within normal limits  CBC WITH DIFFERENTIAL/PLATELET  GC/CHLAMYDIA PROBE AMP ([place]) NOT AT [facility]    EKG None  Radiology US Ob Comp < 14 Wks  Result Date: 04/12/2018 CLINICAL DATA:  Nausea with pregnancy. EXAM: OBSTETRIC <14 WK ULTRASOUND TECHNIQUE: Transabdominal ultrasound was performed for evaluation of the gestation as well as the maternal uterus and adnexal regions. COMPARISON:  Prior ultrasound 09/30/2017 FINDINGS: Intrauterine gestational sac: Single Yolk sac:  Present Embryo:  Present Cardiac Activity: Present Heart Rate: 169 bpm CRL: 5.4 mm   6 w 2 d                  [place] EDC: 12/04/2018 Subchorionic hemorrhage:  None visualized. Maternal uterus/adnexae: The right ovary was not seen. Normal appearance of the left ovary. IMPRESSION: Single live intrauterine pregnancy corresponding to 6 weeks and 2 days gestation. Electronically Signed   By: Fidela Salisbury M.D.   On: 04/12/2018 17:13    Procedures Procedures (including critical care time)  Medications Ordered in ED Medications  metroNIDAZOLE (FLAGYL) tablet 2,000 mg (has no administration in time range)  cefTRIAXone (ROCEPHIN) injection 250 mg (has no administration in time range)  azithromycin (ZITHROMAX) tablet 500 mg (has no  administration in time range)  sodium chloride 0.9 % bolus 1,000 mL (0 mLs Intravenous Stopped 04/12/18 1727)  ondansetron (ZOFRAN) injection 4 mg (4 mg Intravenous Given 04/12/18 1626)     Initial Impression / Assessment and Plan / ED Course  I have reviewed the triage vital signs and the nursing notes.  Pertinent labs & imaging results that were available during my care of the patient were reviewed by me and considered in my medical decision making (see chart for details).    Pt is feeling much better after  IVFs and zofran.  US shows IUP.  Bleeding likely from friable cervix.  She has trichomonas and yeast as well as many WBCs.  I will treat her with rocephin/zithromax/flagyl.  She is instructed to monistat for her yeast infection as diflucan is not recommended in pregnancy.  Sexual partners need to be treated.  She is instructed to f/u with her obgyn.  Return if worse.    Final Clinical Impressions(s) / ED Diagnoses   Final diagnoses:  Cervicitis  Trichomoniasis of vagina  Vaginal candidiasis  Less than [redacted] weeks gestation of pregnancy  Hypokalemia    ED Discharge Orders         Ordered    Prenatal Multivit-Min-Fe-FA (PRENATAL VITAMINS) 0.8 MG tablet  Daily     04/12/18 1814    ondansetron (ZOFRAN ODT) 4 MG disintegrating tablet  Every 8 hours PRN     04/12/18 1814    miconazole (MONISTAT 1 COMBO PACK) kit   Once     04/12/18 1814           Isla Pence, MD 04/12/18 1816

## 2018-04-12 NOTE — ED Notes (Signed)
Going for u/S

## 2018-04-12 NOTE — ED Notes (Signed)
Pt and boyfriend in an argument of test results, yelling very loud, calling security, Boyfriend left department without problems.

## 2018-04-13 LAB — GC/CHLAMYDIA PROBE AMP (~~LOC~~) NOT AT ARMC
Chlamydia: NEGATIVE
Neisseria Gonorrhea: POSITIVE — AB

## 2018-04-15 ENCOUNTER — Encounter (HOSPITAL_COMMUNITY): Payer: Self-pay

## 2018-04-15 ENCOUNTER — Emergency Department (HOSPITAL_COMMUNITY)
Admission: EM | Admit: 2018-04-15 | Discharge: 2018-04-16 | Disposition: A | Discharge status: Home / Self Care | Payer: Medicaid - Out of State | Attending: Emergency Medicine | Admitting: Emergency Medicine

## 2018-04-15 DIAGNOSIS — O2390 Unspecified genitourinary tract infection in pregnancy, unspecified trimester: Secondary | ICD-10-CM | POA: Diagnosis not present

## 2018-04-15 DIAGNOSIS — A5901 Trichomonal vulvovaginitis: Secondary | ICD-10-CM | POA: Diagnosis not present

## 2018-04-15 DIAGNOSIS — A599 Trichomoniasis, unspecified: Secondary | ICD-10-CM

## 2018-04-15 DIAGNOSIS — Z87891 Personal history of nicotine dependence: Secondary | ICD-10-CM | POA: Diagnosis not present

## 2018-04-15 DIAGNOSIS — O98319 Other infections with a predominantly sexual mode of transmission complicating pregnancy, unspecified trimester: Secondary | ICD-10-CM | POA: Diagnosis present

## 2018-04-15 DIAGNOSIS — Z3A00 Weeks of gestation of pregnancy not specified: Secondary | ICD-10-CM | POA: Insufficient documentation

## 2018-04-15 DIAGNOSIS — Z3490 Encounter for supervision of normal pregnancy, unspecified, unspecified trimester: Secondary | ICD-10-CM

## 2018-04-15 NOTE — ED Triage Notes (Signed)
Pt states she was here on the 8th and told she has Trichomonas, states she did not stay to get treated and left bc she was mad at someone (not staff)

## 2018-04-16 MED ORDER — AZITHROMYCIN 250 MG PO TABS
1000.0000 mg | ORAL_TABLET | Freq: Once | ORAL | Status: AC
  Administered 2018-04-16: 1000 mg via ORAL
  Filled 2018-04-16: qty 4

## 2018-04-16 MED ORDER — STERILE WATER FOR INJECTION IJ SOLN
INTRAMUSCULAR | Status: AC
  Administered 2018-04-16: 10 mL
  Filled 2018-04-16: qty 10

## 2018-04-16 MED ORDER — ONDANSETRON HCL 4 MG PO TABS
4.0000 mg | ORAL_TABLET | Freq: Once | ORAL | Status: AC
  Administered 2018-04-16: 4 mg via ORAL
  Filled 2018-04-16: qty 1

## 2018-04-16 MED ORDER — CEFTRIAXONE SODIUM 250 MG IJ SOLR
250.0000 mg | Freq: Once | INTRAMUSCULAR | Status: AC
  Administered 2018-04-16: 250 mg via INTRAMUSCULAR
  Filled 2018-04-16: qty 250

## 2018-04-16 MED ORDER — METRONIDAZOLE 500 MG PO TABS
2000.0000 mg | ORAL_TABLET | Freq: Once | ORAL | Status: AC
  Administered 2018-04-16: 2000 mg via ORAL
  Filled 2018-04-16: qty 4

## 2018-04-16 NOTE — ED Provider Notes (Signed)
[facility] EMERGENCY DEPARTMENT Provider Note   CSN: [id number] Arrival date & time: 04/15/18  2302     History   Chief Complaint Chief Complaint  Patient presents with  . S74.5    HPI Tina Beck is a 22 y.o. female.  Patient is a 22 year old female who presents to the emergency department for completion of her treatment and management of an STD.  The patient was seen in the emergency department October 8.  Patient was diagnosed with an STD.  She was also found to be pregnant.  The patient left the emergency department AGAINST MEDICAL ADVICE before the treatment could be given.  The patient states she returns tonight to get the treatment that she was supposed to get on October 8.  The history is provided by the patient.    History reviewed. No pertinent past medical history.  Patient Active Problem List   Diagnosis Date Noted  . Chlamydia 03/22/2017  . Missed abortion 10/14/2015    Past Surgical History:  Procedure Laterality Date  . MOUTH SURGERY    . OPEN REDUCTION INTERNAL FIXATION (ORIF) METACARPAL Right 03/23/2016   Procedure: OPEN REDUCTION INTERNAL FIXATION OF RIGHT 5TH METACARPAL;  Surgeon: Dairl Ponder, MD;  Location: [facility];  Service: Orthopedics;  Laterality: Right;     OB History    Gravida  3   Para      Term      Preterm      AB  1   Living        SAB  1   TAB      Ectopic      Multiple      Live Births               Home Medications    Prior to Admission medications   Medication Sig Start Date End Date Taking? Authorizing Provider  ondansetron (ZOFRAN ODT) 4 MG disintegrating tablet Take 1 tablet (4 mg total) by mouth every 8 (eight) hours as needed. 04/12/18   Jacalyn Lefevre, MD  Prenatal Multivit-Min-Fe-FA (PRENATAL VITAMINS) 0.8 MG tablet Take 1 tablet by mouth daily. 04/12/18   Jacalyn Lefevre, MD    Family History No family history on file.  Social History Social History   Tobacco  Use  . Smoking status: Former Games developer  . Smokeless tobacco: Never Used  Substance Use Topics  . Alcohol use: No  . Drug use: No     Allergies   Penicillins   Review of Systems Review of Systems  Constitutional: Negative for activity change, chills and fever.       All ROS Neg except as noted in HPI  HENT: Negative for nosebleeds.   Eyes: Negative for photophobia and discharge.  Respiratory: Negative for cough, shortness of breath and wheezing.   Cardiovascular: Negative for chest pain and palpitations.  Gastrointestinal: Positive for abdominal pain. Negative for blood in stool.  Genitourinary: Positive for vaginal discharge. Negative for dysuria, frequency and hematuria.  Musculoskeletal: Negative for arthralgias, back pain and neck pain.  Skin: Negative.   Neurological: Negative for dizziness, seizures and speech difficulty.  Psychiatric/Behavioral: Negative for confusion and hallucinations.     Physical Exam Updated Vital Signs BP 108/60 (BP Location: Right Arm)   Pulse 78   Temp 99 F (37.2 C) (Oral)   Resp 15   Ht 5\' 11"  (1.803 m)   Wt 72.6 kg   LMP 03/06/2018   SpO2 98%   BMI 22.32 kg/m  Physical Exam  Constitutional: She is oriented to person, place, and time. She appears well-developed and well-nourished.  Non-toxic appearance.  HENT:  Head: Normocephalic.  Right Ear: Tympanic membrane and external ear normal.  Left Ear: Tympanic membrane and external ear normal.  Eyes: Pupils are equal, round, and reactive to light. EOM and lids are normal.  Neck: Normal range of motion. Neck supple. Carotid bruit is not present.  Cardiovascular: Normal rate, regular rhythm, normal heart sounds, intact distal pulses and normal pulses.  Pulmonary/Chest: Breath sounds normal. No respiratory distress.  Abdominal: Soft. Bowel sounds are normal. There is tenderness in the suprapubic area. There is no rigidity, no guarding and no CVA tenderness.  Musculoskeletal: Normal range  of motion.  Lymphadenopathy:       Head (right side): No submandibular adenopathy present.       Head (left side): No submandibular adenopathy present.    She has no cervical adenopathy.  Neurological: She is alert and oriented to person, place, and time. She has normal strength. No cranial nerve deficit or sensory deficit.  Skin: Skin is warm and dry.  Psychiatric: She has a normal mood and affect. Her speech is normal.  Nursing note and vitals reviewed.    ED Treatments / Results  Labs (all labs ordered are listed, but only abnormal results are displayed) Labs Reviewed - No data to display  EKG None  Radiology No results found.  Procedures Procedures (including critical care time)  Medications Ordered in ED Medications  cefTRIAXone (ROCEPHIN) injection 250 mg (has no administration in time range)  azithromycin (ZITHROMAX) tablet 1,000 mg (has no administration in time range)  metroNIDAZOLE (FLAGYL) tablet 2,000 mg (has no administration in time range)  ondansetron (ZOFRAN) tablet 4 mg (has no administration in time range)     Initial Impression / Assessment and Plan / ED Course  I have reviewed the triage vital signs and the nursing notes.  Pertinent labs & imaging results that were available during my care of the patient were reviewed by me and considered in my medical decision making (see chart for details).       Final Clinical Impressions(s) / ED Diagnoses MDM  Vital signs reviewed.  Pulse oximetry is 98% on room air.  Within normal limits by my interpretation.  On October 8 the patient was diagnosed with trichomonas.  The patient left however before receiving her medication.  The patient will be treated with Flagyl, Zithromax, and Rocephin.  Patient strongly advised to see the GYN physician for additional evaluation and management.  Patient acknowledges understanding of these instructions.   Final diagnoses:  Trichimoniasis  Pregnancy, unspecified  gestational age    ED Discharge Orders    None       Ivery Quale, PA-C 04/16/18 1610    Glynn Octave, MD 04/16/18 [id number]

## 2018-04-16 NOTE — Discharge Instructions (Addendum)
Your test on the previous visit suggest trichomonas.  You were treated tonight for trichomonas and also other organisms that usually accompany trichomonas.  Please make sure that your partner or partners are seen by the health department for evaluation and management.  Please refrain from all sexual activity over the next 7 days.  Please see your GYN physician for follow-up and recheck of this infection.

## 2018-09-03 ENCOUNTER — Other Ambulatory Visit: Payer: Self-pay

## 2018-09-03 ENCOUNTER — Inpatient Hospital Stay (HOSPITAL_COMMUNITY)
Admission: AD | Admit: 2018-09-03 | Discharge: 2018-09-03 | Disposition: A | Discharge status: Home / Self Care | Payer: Medicaid - Out of State | Attending: Obstetrics and Gynecology | Admitting: Obstetrics and Gynecology

## 2018-09-03 ENCOUNTER — Encounter (HOSPITAL_COMMUNITY): Payer: Self-pay | Admitting: Emergency Medicine

## 2018-09-03 DIAGNOSIS — Z79899 Other long term (current) drug therapy: Secondary | ICD-10-CM | POA: Insufficient documentation

## 2018-09-03 DIAGNOSIS — Z87891 Personal history of nicotine dependence: Secondary | ICD-10-CM | POA: Diagnosis not present

## 2018-09-03 DIAGNOSIS — O26892 Other specified pregnancy related conditions, second trimester: Secondary | ICD-10-CM

## 2018-09-03 DIAGNOSIS — R102 Pelvic and perineal pain: Secondary | ICD-10-CM | POA: Diagnosis not present

## 2018-09-03 DIAGNOSIS — M79606 Pain in leg, unspecified: Secondary | ICD-10-CM | POA: Insufficient documentation

## 2018-09-03 DIAGNOSIS — O9989 Other specified diseases and conditions complicating pregnancy, childbirth and the puerperium: Secondary | ICD-10-CM | POA: Diagnosis not present

## 2018-09-03 DIAGNOSIS — M549 Dorsalgia, unspecified: Secondary | ICD-10-CM | POA: Insufficient documentation

## 2018-09-03 DIAGNOSIS — M545 Low back pain, unspecified: Secondary | ICD-10-CM

## 2018-09-03 DIAGNOSIS — Z3A27 27 weeks gestation of pregnancy: Secondary | ICD-10-CM | POA: Diagnosis not present

## 2018-09-03 HISTORY — DX: Other specified health status: Z78.9

## 2018-09-03 LAB — WET PREP, GENITAL
CLUE CELLS WET PREP: NONE SEEN
SPERM: NONE SEEN
TRICH WET PREP: NONE SEEN

## 2018-09-03 LAB — URINALYSIS, ROUTINE W REFLEX MICROSCOPIC
BILIRUBIN URINE: NEGATIVE
Glucose, UA: NEGATIVE mg/dL
Hgb urine dipstick: NEGATIVE
KETONES UR: NEGATIVE mg/dL
Nitrite: NEGATIVE
PH: 6 (ref 5.0–8.0)
Protein, ur: 30 mg/dL — AB
SPECIFIC GRAVITY, URINE: 1.028 (ref 1.005–1.030)
WBC, UA: >50 WBC/hpf — ABNORMAL HIGH (ref 0–5)

## 2018-09-03 LAB — CBC
HCT: 34.7 % — ABNORMAL LOW (ref 36.0–46.0)
Hemoglobin: 11.4 g/dL — ABNORMAL LOW (ref 12.0–15.0)
MCH: 30.7 pg (ref 26.0–34.0)
MCHC: 32.9 g/dL (ref 30.0–36.0)
MCV: 93.5 fL (ref 80.0–100.0)
Platelets: 270 10*3/uL (ref 150–400)
RBC: 3.71 MIL/uL — AB (ref 3.87–5.11)
RDW: 12.4 % (ref 11.5–15.5)
WBC: 13.9 10*3/uL — ABNORMAL HIGH (ref 4.0–10.5)
nRBC: 0 % (ref 0.0–0.2)

## 2018-09-03 LAB — BASIC METABOLIC PANEL
Anion gap: 6 (ref 5–15)
BUN: 9 mg/dL (ref 6–20)
CO2: 19 mmol/L — ABNORMAL LOW (ref 22–32)
CREATININE: 0.45 mg/dL (ref 0.44–1.00)
Calcium: 8.6 mg/dL — ABNORMAL LOW (ref 8.9–10.3)
Chloride: 110 mmol/L (ref 98–111)
GFR calc Af Amer: >60 mL/min (ref >60)
GLUCOSE: 85 mg/dL (ref 70–99)
Potassium: 3.6 mmol/L (ref 3.5–5.1)
SODIUM: 135 mmol/L (ref 135–145)

## 2018-09-03 LAB — HCG, QUANTITATIVE, PREGNANCY: HCG, BETA CHAIN, QUANT, S: 7247 m[IU]/mL — AB (ref <5)

## 2018-09-03 MED ORDER — CYCLOBENZAPRINE HCL 10 MG PO TABS: 10.0000 mg | ORAL_TABLET | Freq: Two times a day (BID) | ORAL | 0 refills | Status: DC | PRN

## 2018-09-03 MED ORDER — TERCONAZOLE 0.4 % VA CREA: 1.0000 | TOPICAL_CREAM | Freq: Every day | VAGINAL | 0 refills | Status: DC

## 2018-09-03 MED ORDER — CYCLOBENZAPRINE HCL 10 MG PO TABS
5.0000 mg | ORAL_TABLET | Freq: Once | ORAL | Status: AC
  Administered 2018-09-03: 5 mg via ORAL
  Filled 2018-09-03: qty 1

## 2018-09-03 NOTE — Discharge Instructions (Addendum)

## 2018-09-03 NOTE — ED Triage Notes (Signed)
Patient c/o lower back and pelvis pain. Patient 7 months pregnant. Due date May 29th. Denies any vaginal bleeding. Patient is 7 months pregnant. Per patient third pregnancy. 1 pregnancy miscarriage, 2nd pregnancy term with no complications. Patient doesn't have OB/Gyn.

## 2018-09-03 NOTE — Progress Notes (Signed)
Report given to Sabino Gasser, charge nurse, maternity admissions unit.

## 2018-09-03 NOTE — ED Provider Notes (Addendum)
[facility] EMERGENCY DEPARTMENT Provider Note   CSN: [id number] Arrival date & time: 09/03/18  1333    History   Chief Complaint Chief Complaint  Patient presents with  . Pelvic Pain    HPI Tina Beck is a 23 y.o. female.     Patient is 27 weeks 1 day pregnant.  Due date is May 29.  Gravida 3 para 1.  Presents today with bilateral upper thigh pain that is been going on for a couple days.  But is also having intermittent back pain that started yesterday.  Patient's had a previous pregnancy miscarriage.  Patient has not seen OB/GYN yet.  However she was evaluated by a clinic in the [place] area that projected her due date to be May 29.  Patient does have follow-up in the [facility] area for her first OB/GYN appointment on March 10.  Patient denies any vaginal bleeding or any fluid discharge.  She does not feel as if the back pain feels like contractions.  No visual changes no sniffing swelling to her lower extremities.  No headache.     History reviewed. No pertinent past medical history.  Patient Active Problem List   Diagnosis Date Noted  . Chlamydia 03/22/2017  . Missed abortion 10/14/2015    Past Surgical History:  Procedure Laterality Date  . MOUTH SURGERY    . OPEN REDUCTION INTERNAL FIXATION (ORIF) METACARPAL Right 03/23/2016   Procedure: OPEN REDUCTION INTERNAL FIXATION OF RIGHT 5TH METACARPAL;  Surgeon: Dairl Ponder, MD;  Location: [facility];  Service: Orthopedics;  Laterality: Right;     OB History    Gravida  3   Para      Term      Preterm      AB  1   Living        SAB  1   TAB      Ectopic      Multiple      Live Births               Home Medications    Prior to Admission medications   Medication Sig Start Date End Date Taking? Authorizing Provider  ondansetron (ZOFRAN ODT) 4 MG disintegrating tablet Take 1 tablet (4 mg total) by mouth every 8 (eight) hours as needed. Patient not taking: Reported on  09/03/2018 04/12/18   Jacalyn Lefevre, MD  Prenatal Multivit-Min-Fe-FA (PRENATAL VITAMINS) 0.8 MG tablet Take 1 tablet by mouth daily. Patient not taking: Reported on 09/03/2018 04/12/18   Jacalyn Lefevre, MD    Family History No family history on file.  Social History Social History   Tobacco Use  . Smoking status: Former Games developer  . Smokeless tobacco: Never Used  Substance Use Topics  . Alcohol use: No  . Drug use: No     Allergies   Penicillins   Review of Systems Review of Systems  Constitutional: Negative for chills and fever.  HENT: Negative for congestion, rhinorrhea and sore throat.   Eyes: Negative for visual disturbance.  Respiratory: Negative for cough and shortness of breath.   Cardiovascular: Negative for chest pain and leg swelling.  Gastrointestinal: Negative for abdominal pain, diarrhea, nausea and vomiting.  Genitourinary: Negative for dysuria, flank pain, hematuria, pelvic pain, vaginal bleeding and vaginal discharge.  Musculoskeletal: Positive for back pain. Negative for neck pain.  Skin: Negative for rash.  Neurological: Negative for dizziness, light-headedness and headaches.  Hematological: Does not bruise/bleed easily.  Psychiatric/Behavioral: Negative for confusion.  Physical Exam Updated Vital Signs BP 110/60 (BP Location: Right Arm)   Pulse 80   Temp 98 F (36.7 C) (Oral)   Resp 18   Ht 1.829 m (6')   Wt 71.8 kg   LMP 03/04/2018   SpO2 100%   BMI 21.48 kg/m   Physical Exam Vitals signs and nursing note reviewed.  Constitutional:      General: She is not in acute distress.    Appearance: She is well-developed.  HENT:     Head: Normocephalic and atraumatic.  Eyes:     Conjunctiva/sclera: Conjunctivae normal.  Neck:     Musculoskeletal: Normal range of motion and neck supple.  Cardiovascular:     Rate and Rhythm: Normal rate and regular rhythm.     Heart sounds: Normal heart sounds. No murmur.  Pulmonary:     Effort: Pulmonary  effort is normal. No respiratory distress.     Breath sounds: Normal breath sounds.  Abdominal:     General: Bowel sounds are normal.     Palpations: Abdomen is soft.     Tenderness: There is no abdominal tenderness.     Comments: Abdomen gravid palpation of the uterus is above the umbilicus consistent with about 27 weeks.  Slightly above the umbilicus.  Musculoskeletal: Normal range of motion.        General: No swelling.  Skin:    General: Skin is warm and dry.  Neurological:     Mental Status: She is alert.      ED Treatments / Results  Labs (all labs ordered are listed, but only abnormal results are displayed) Labs Reviewed  CBC - Abnormal; Notable for the following components:      Result Value   WBC 13.9 (*)    RBC 3.71 (*)    Hemoglobin 11.4 (*)    HCT 34.7 (*)    All other components within normal limits  HCG, QUANTITATIVE, PREGNANCY  BASIC METABOLIC PANEL  URINALYSIS, ROUTINE W REFLEX MICROSCOPIC    EKG None  Radiology No results found.  Procedures Procedures (including critical care time)  CRITICAL CARE Performed by: Vanetta Mulders Total critical care time: 30 minutes Critical care time was exclusive of separately billable procedures and treating other patients. Critical care was necessary to treat or prevent imminent or life-threatening deterioration. Critical care was time spent personally by me on the following activities: development of treatment plan with patient and/or surrogate as well as nursing, discussions with consultants, evaluation of patient's response to treatment, examination of patient, obtaining history from patient or surrogate, ordering and performing treatments and interventions, ordering and review of laboratory studies, ordering and review of radiographic studies, pulse oximetry and re-evaluation of patient's condition.   Medications Ordered in ED Medications - No data to display   Initial Impression / Assessment and Plan / ED  Course  I have reviewed the triage vital signs and the nursing notes.  Pertinent labs & imaging results that were available during my care of the patient were reviewed by me and considered in my medical decision making (see chart for details).       Patient is thigh symptoms probably unrelated to any pregnancy complication.  However the back pain is intermittent.  Patient put on fetal monitoring.  Patient's initial vital signs here without elevation of blood pressure.  Patient nontoxic no acute distress.  No vaginal bleeding no vaginal discharge.  However the intermittent back pain certainly could be related to contractions.  The patient feels that  it is not.  [facility] is requesting evaluation on the fetal monitoring unit.  Patient will be transferred.  Dr. Vergie Living was the physician record for transfer.  Patient's labs are pending.  Which includes urinalysis.  In addition the L&D unit did not feel that there was any abnormalities on fetal monitoring or all maternal monitoring.   Final Clinical Impressions(s) / ED Diagnoses   Final diagnoses:  [redacted] weeks gestation of pregnancy  Acute bilateral low back pain without sciatica    ED Discharge Orders    None       Vanetta Mulders, MD 09/03/18 1454    Vanetta Mulders, MD 09/03/18 1455

## 2018-09-03 NOTE — Progress Notes (Signed)
Received call from [facility] RN. Pt is a G3 P1 the patient has had no prenatal care. She says she has had one ultrasound and has a due date of Dec 02 2018. If her EDC is correct, she is 27 1/7 weeks. RN says that pt denies vaginal bleeding or leaking of fluid. She is complaining of lower back and pelvic pain. Pt says she has had one miscarriage and one term delivery.

## 2018-09-03 NOTE — Progress Notes (Signed)
Spoke to Dr. Vergie Living. Pt presents to [facility] with c/o lower abd pain and back pain. No vaginal bleeding or leaking of fluid. FHR baseline is 145-150 BPm, moderate variability, accels, no decels and no uc's are tracing. Labs have been drawn, CBC, CMP, HCG, and a urinalysis has been sent. Pt has had no prenatal care. She was seen at [facility] on 04/12/2018 and had an ultrasound and was given an EDC of 12/04/2018 which would make her 26 6/7 weeks. Pt is to be transferred to [facility], Maternity Admissions unit for a labor evaluation.

## 2018-09-03 NOTE — MAU Note (Signed)
Pt transferred from [facility] c/o mid to  lower back pain at is sharp and throbbing at times. Pain started yesterday.Denies any vag bleeding or leaking. Good fetal movement felt.

## 2018-09-03 NOTE — ED Notes (Signed)
Pt placed on fetal heart monitor. Mary RN called at [employer].  VSS.

## 2018-09-03 NOTE — ED Notes (Signed)
Pt was asked about her prenatal care and states she plans to deliver at [facility].  When asked if she had established care there, pt states she had not and was just planning to go there whenever she went into labor.  Pt educated on importance of establishing routine prenatal care and a delivery plan including the dangers of just showing up to a hospital in labor with no record of pregnancy.  Pt verbalized understanding and states she has an appointment with [facility] in [place] next month.

## 2018-09-03 NOTE — MAU Provider Note (Signed)
History     CSN: [id number]  Arrival date and time: 09/03/18 1333   First Provider Initiated Contact with Patient 09/03/18 1558      Chief Complaint  Patient presents with  . Pelvic Pain   HPI Tina Beck is a 23 y.o. G3P1011 at [redacted]w[redacted]d who presents from [facility] ED with pelvic pain. She states this has been ongoing for 2 days and also has back pain. She states she has tried tylenol and hot packs with relief. She has not been seen anywhere in the pregnancy for prenatal care but has an appointment in [place] on March 10. She denies any leaking, bleeding or vaginal discharge. Reports normal fetal movement. She was transferred to MAU to rule out labor.   OB History    Gravida  3   Para  1   Term  1   Preterm      AB  1   Living  1     SAB  1   TAB      Ectopic      Multiple      Live Births  1           Past Medical History:  Diagnosis Date  . Medical history non-contributory     Past Surgical History:  Procedure Laterality Date  . MOUTH SURGERY    . OPEN REDUCTION INTERNAL FIXATION (ORIF) METACARPAL Right 03/23/2016   Procedure: OPEN REDUCTION INTERNAL FIXATION OF RIGHT 5TH METACARPAL;  Surgeon: Dairl Ponder, MD;  Location: [facility];  Service: Orthopedics;  Laterality: Right;    History reviewed. No pertinent family history.  Social History   Tobacco Use  . Smoking status: Former Games developer  . Smokeless tobacco: Never Used  Substance Use Topics  . Alcohol use: No  . Drug use: No    Allergies:  Allergies  Allergen Reactions  . Penicillins Hives and Itching    Has patient had a PCN reaction causing immediate rash, facial/tongue/throat swelling, SOB or lightheadedness with hypotension: No Has patient had a PCN reaction causing severe rash involving mucus membranes or skin necrosis: No Has patient had a PCN reaction that required hospitalization: No Has patient had a PCN reaction occurring within the last 10 years: Yes If  all of the above answers are "NO", then may proceed with Cephalosporin use.     Medications Prior to Admission  Medication Sig Dispense Refill Last Dose  . ondansetron (ZOFRAN ODT) 4 MG disintegrating tablet Take 1 tablet (4 mg total) by mouth every 8 (eight) hours as needed. (Patient not taking: Reported on 09/03/2018) 10 tablet 0 Not Taking at Unknown time  . Prenatal Multivit-Min-Fe-FA (PRENATAL VITAMINS) 0.8 MG tablet Take 1 tablet by mouth daily. (Patient not taking: Reported on 09/03/2018) 30 tablet 0 Not Taking at Unknown time    Review of Systems  Constitutional: Negative.  Negative for fatigue and fever.  HENT: Negative.   Respiratory: Negative.  Negative for shortness of breath.   Cardiovascular: Negative.  Negative for chest pain.  Gastrointestinal: Negative.  Negative for abdominal pain, constipation, diarrhea, nausea and vomiting.  Genitourinary: Positive for pelvic pain. Negative for dysuria, vaginal bleeding and vaginal discharge.  Musculoskeletal: Positive for back pain.  Neurological: Negative.  Negative for dizziness and headaches.   Physical Exam   Blood pressure 105/67, pulse 79, temperature 98 F (36.7 C), temperature source Oral, resp. rate 18, height 6' (1.829 m), weight 71.8 kg, last menstrual period 03/04/2018, SpO2 100 %, unknown if  currently breastfeeding.  Physical Exam  Nursing note and vitals reviewed. Constitutional: She is oriented to person, place, and time. She appears well-developed and well-nourished. No distress.  HENT:  Head: Normocephalic.  Eyes: Pupils are equal, round, and reactive to light.  Cardiovascular: Normal rate, regular rhythm and normal heart sounds.  Respiratory: Effort normal and breath sounds normal. No respiratory distress.  GI: Soft. Bowel sounds are normal. She exhibits no distension. There is no abdominal tenderness.  Neurological: She is alert and oriented to person, place, and time.  Skin: Skin is warm and dry.   Psychiatric: She has a normal mood and affect. Her behavior is normal. Judgment and thought content normal.   Dilation: Closed Effacement (%): Thick Cervical Position: Posterior Exam by:: C.Neil,CNM  Fetal Tracing:  Baseline: 140 Variability: moderate Accels: 15x15  Decels: none  Toco: ui, no UCs   MAU Course  Procedures Results for orders placed or performed during the hospital encounter of 09/03/18 (from the past 24 hour(s))  Urinalysis, Routine w reflex microscopic     Status: Abnormal   Collection Time: 09/03/18  1:49 PM  Result Value Ref Range   Color, Urine YELLOW YELLOW   APPearance CLOUDY (A) CLEAR   Specific Gravity, Urine 1.028 1.005 - 1.030   pH 6.0 5.0 - 8.0   Glucose, UA NEGATIVE NEGATIVE mg/dL   Hgb urine dipstick NEGATIVE NEGATIVE   Bilirubin Urine NEGATIVE NEGATIVE   Ketones, ur NEGATIVE NEGATIVE mg/dL   Protein, ur 30 (A) NEGATIVE mg/dL   Nitrite NEGATIVE NEGATIVE   Leukocytes,Ua LARGE (A) NEGATIVE   RBC / HPF 21-50 0 - 5 RBC/hpf   WBC, UA >50 (H) 0 - 5 WBC/hpf   Bacteria, UA RARE (A) NONE SEEN   Squamous Epithelial / LPF 21-50 0 - 5   Mucus PRESENT    Budding Yeast PRESENT   hCG, quantitative, pregnancy     Status: Abnormal   Collection Time: 09/03/18  2:16 PM  Result Value Ref Range   hCG, Beta Chain, Quant, S 7,247 (H) <5 mIU/mL  CBC     Status: Abnormal   Collection Time: 09/03/18  2:16 PM  Result Value Ref Range   WBC 13.9 (H) 4.0 - 10.5 K/uL   RBC 3.71 (L) 3.87 - 5.11 MIL/uL   Hemoglobin 11.4 (L) 12.0 - 15.0 g/dL   HCT 88.5 (L) 02.7 - 74.1 %   MCV 93.5 80.0 - 100.0 fL   MCH 30.7 26.0 - 34.0 pg   MCHC 32.9 30.0 - 36.0 g/dL   RDW 28.7 86.7 - 67.2 %   Platelets 270 150 - 400 K/uL   nRBC 0.0 0.0 - 0.2 %  Basic metabolic panel     Status: Abnormal   Collection Time: 09/03/18  2:16 PM  Result Value Ref Range   Sodium 135 135 - 145 mmol/L   Potassium 3.6 3.5 - 5.1 mmol/L   Chloride 110 98 - 111 mmol/L   CO2 19 (L) 22 - 32 mmol/L    Glucose, Bld 85 70 - 99 mg/dL   BUN 9 6 - 20 mg/dL   Creatinine, Ser 0.94 0.44 - 1.00 mg/dL   Calcium 8.6 (L) 8.9 - 10.3 mg/dL   GFR calc non Af Amer >60 >60 mL/min   GFR calc Af Amer >60 >60 mL/min   Anion gap 6 5 - 15  Wet prep, genital     Status: Abnormal   Collection Time: 09/03/18  4:07 PM  Result Value Ref Range  Yeast Wet Prep HPF POC PRESENT (A) NONE SEEN   Trich, Wet Prep NONE SEEN NONE SEEN   Clue Cells Wet Prep HPF POC NONE SEEN NONE SEEN   WBC, Wet Prep HPF POC MANY (A) NONE SEEN   Sperm NONE SEEN    MDM Labs ordered at [facility] reviewed Wet prep and gc/chlamydia Flexeril PO PO hydration  Patient reports relief from pain.   Assessment and Plan   1. [redacted] weeks gestation of pregnancy   2. Acute bilateral low back pain without sciatica    -Discharge home in stable condition -Rx for terazol sent to patient's pharmacy -Preterm labor precautions discussed -Patient advised to follow-up with OB of choice to start prenatal care ASAP. Importance of prenatal care reviewed at length with patient.  -Patient may return to MAU as needed or if her condition were to change or worsen   Rolm Bookbinder CNM 09/03/2018, 3:59 PM

## 2018-09-03 NOTE — Progress Notes (Signed)
Spoke with [facility] RN, Crystal. Dr. Vergie Living wants the pt transferred to [facility], Maternity Admissions Unit for a labor evaluation.

## 2018-09-03 NOTE — Progress Notes (Signed)
Pt had an ultrasound at [facility] on 04/12/2018. [facility] 12/04/2018. Pt is 26 6/7 weeks.

## 2018-09-05 LAB — GC/CHLAMYDIA PROBE AMP (~~LOC~~) NOT AT ARMC
Chlamydia: NEGATIVE
NEISSERIA GONORRHEA: NEGATIVE

## 2019-02-24 ENCOUNTER — Encounter (HOSPITAL_COMMUNITY): Payer: Self-pay

## 2019-03-10 IMAGING — US US OB TRANSVAGINAL
1 series · 15 of 28 positions shown · non-contrast
Comparison: None for this gestation

CLINICAL DATA: Pelvic pain affecting first trimester of pregnancy
RIGHT-sided pain with spotting since last night

EXAM:
OBSTETRIC <14 WK US AND TRANSVAGINAL OB US
TECHNIQUE: Both transabdominal and transvaginal ultrasound examinations were
performed for complete evaluation of the gestation as well as the
maternal uterus, adnexal regions, and pelvic cul-de-sac.
Transvaginal technique was performed to assess early pregnancy.

[Series 1: us ob transvaginal · 15 of 37 slices shown]
[im 1/37]
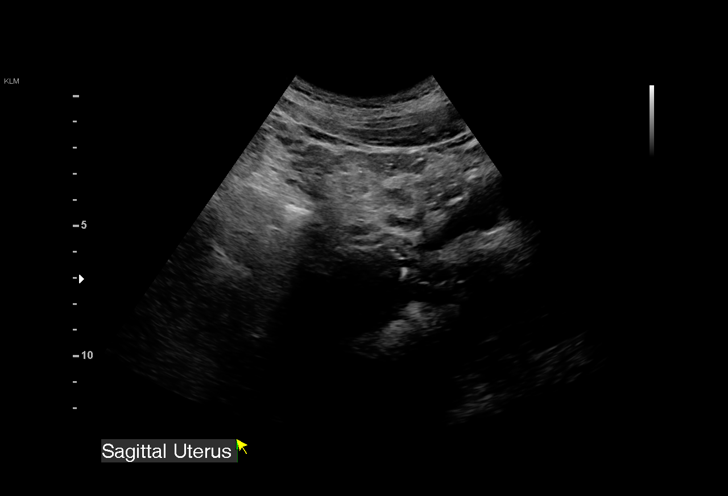
[im 3/37]
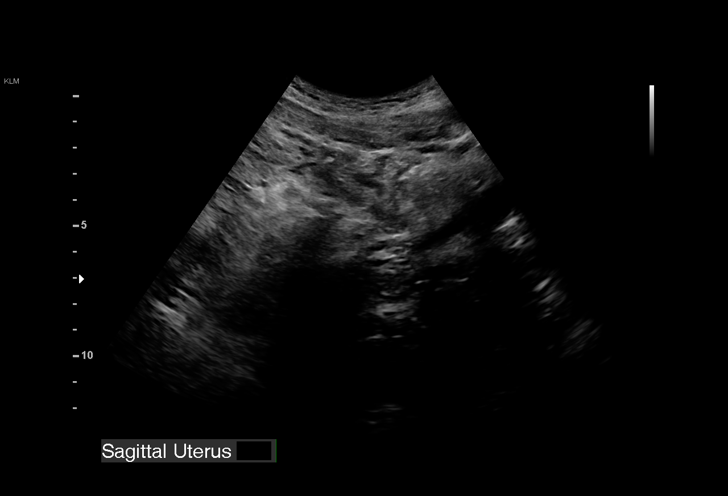
[im 6/37]
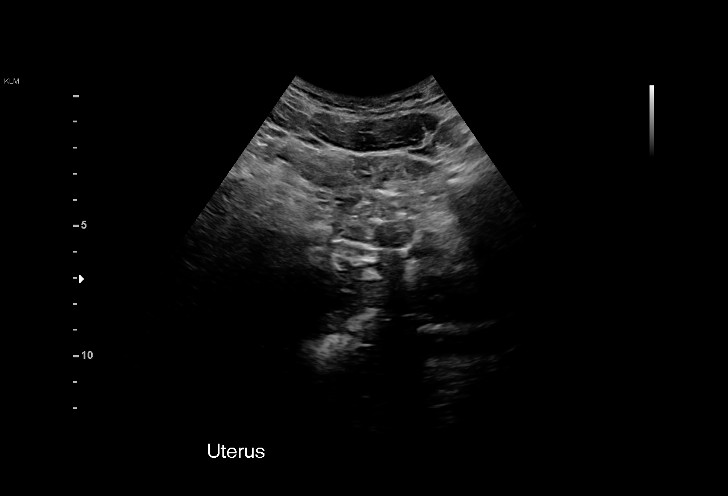
[im 9/37]
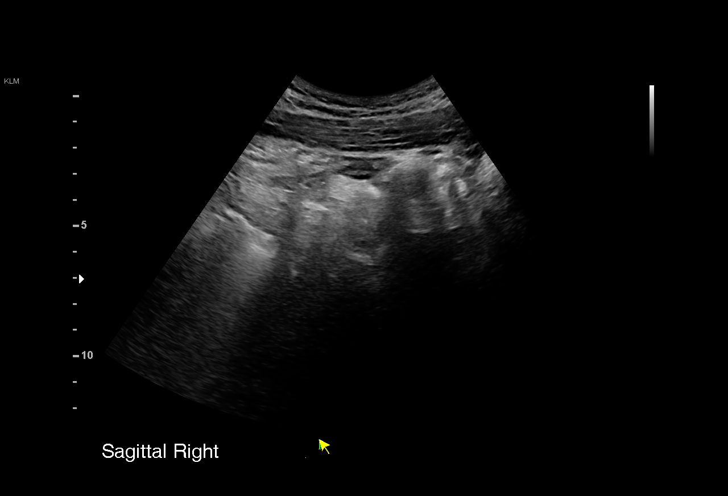
[im 11/37]
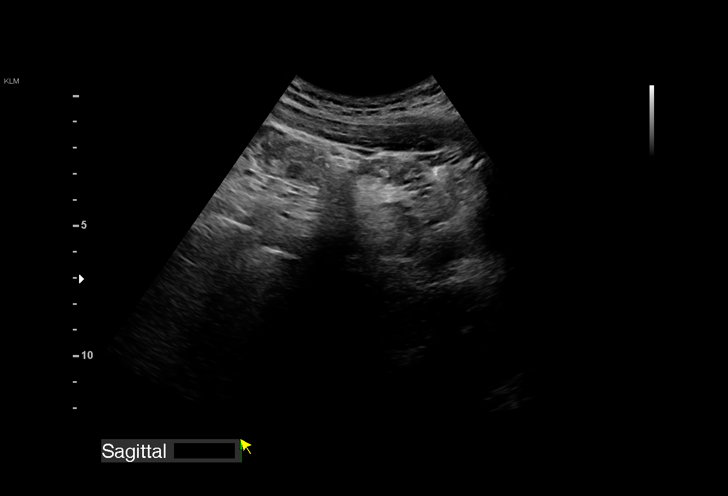
[im 14/37]
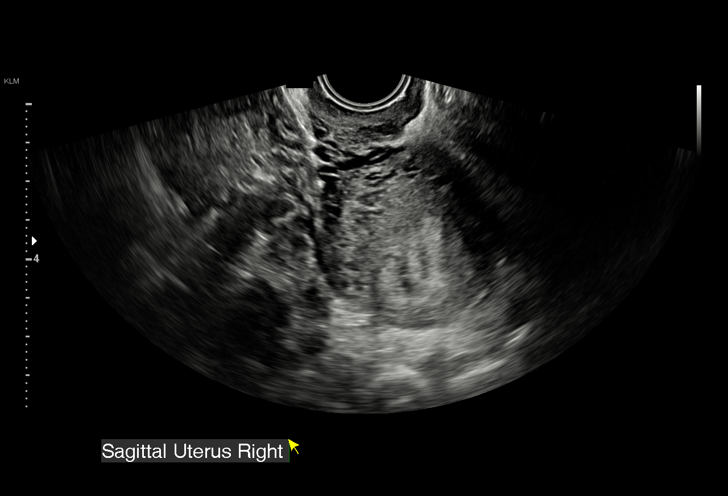
[im 17/37]
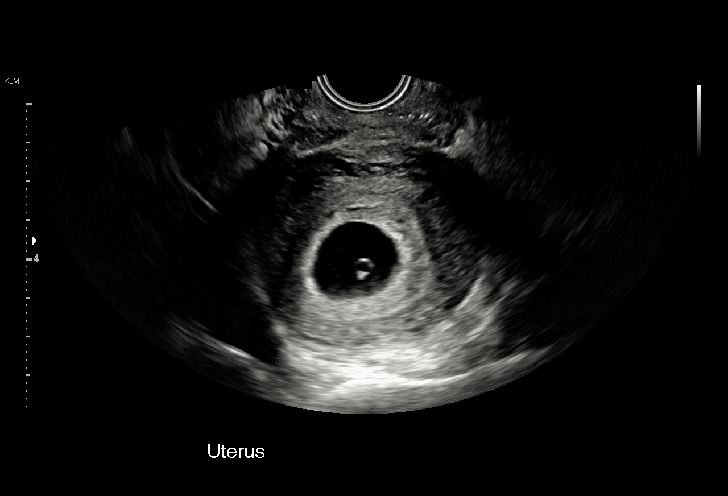
[im 19/37]
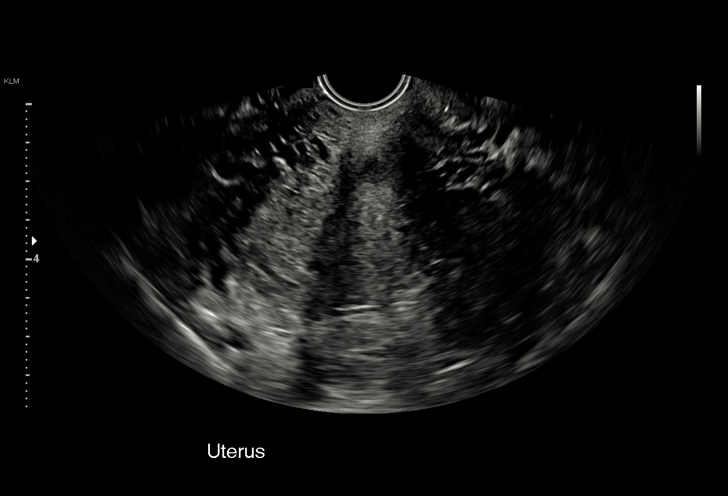
[im 21/37]
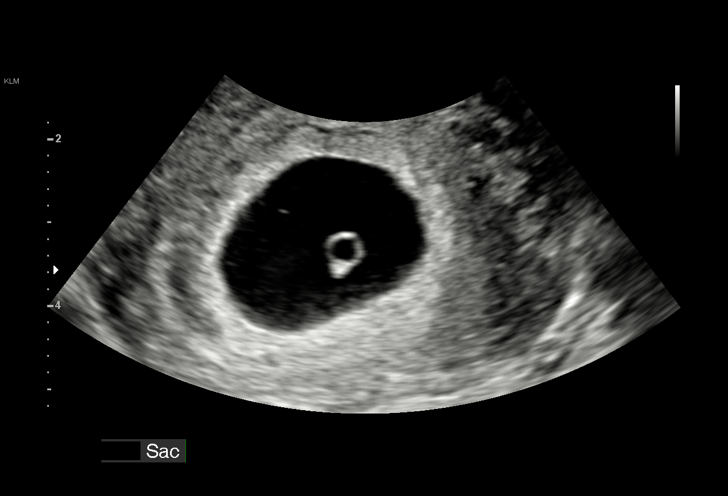
[im 23/37]
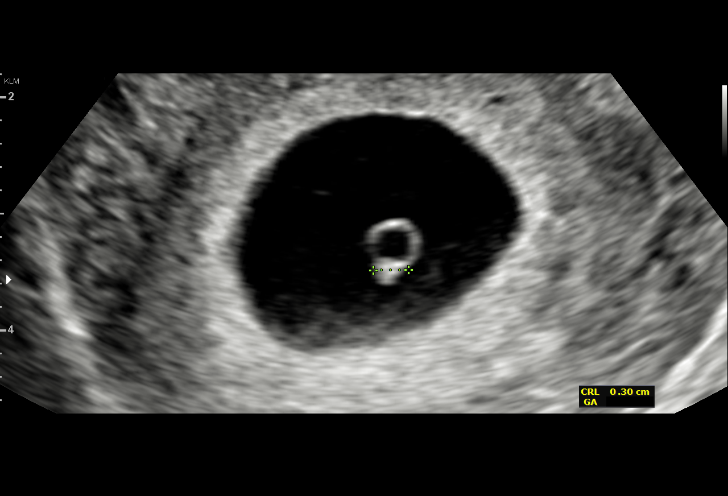
[im 26/37]
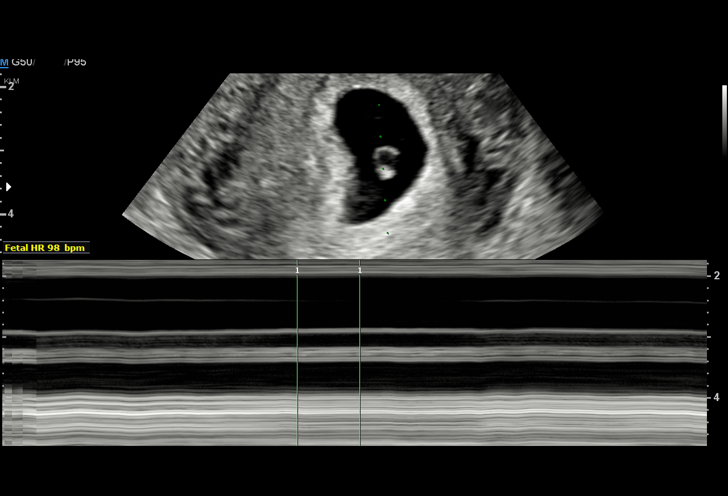
[im 29/37]
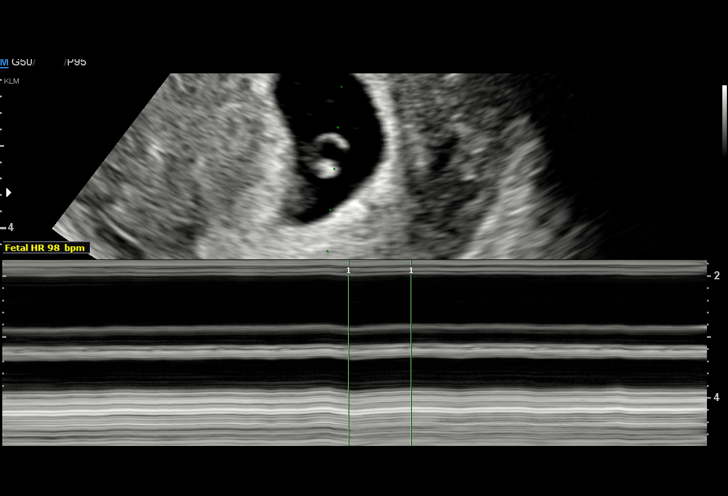
[im 31/37]
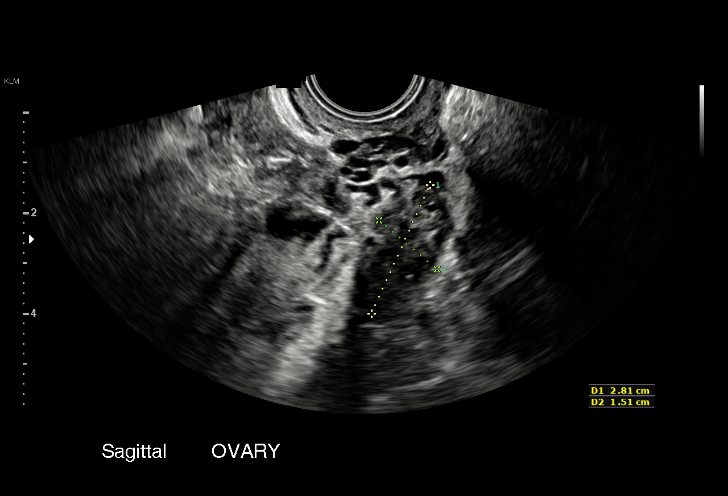
[im 34/37]
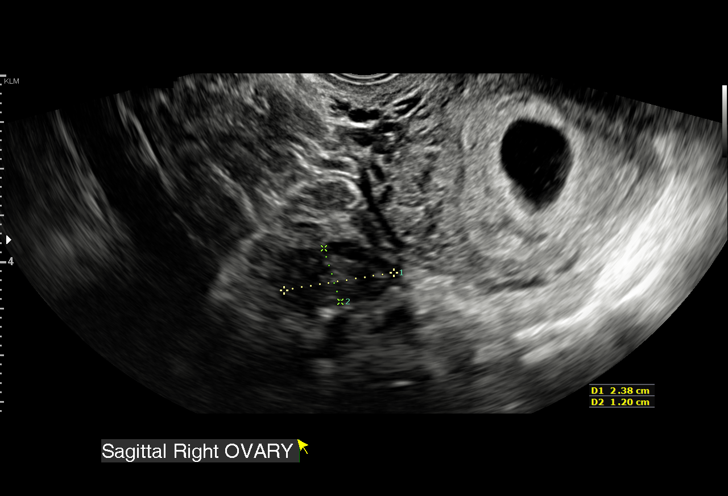
[im 37/37]
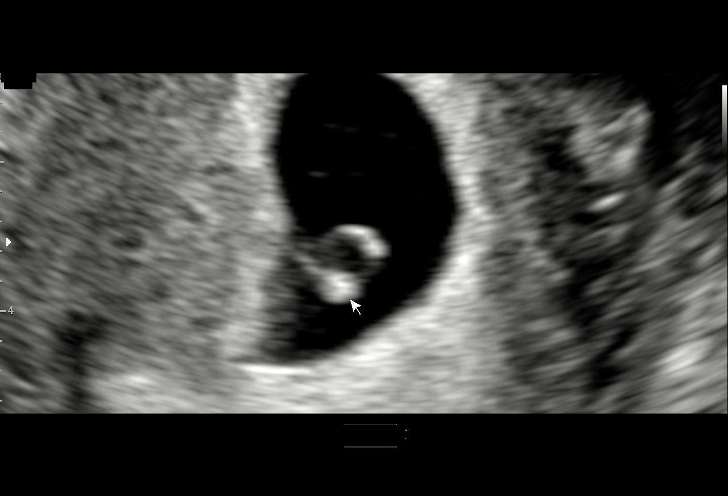

[15 of 28 positions shown; findings below may reference images not displayed]

FINDINGS: Intrauterine gestational sac: Present

Yolk sac:  Present

Embryo:  Present

Cardiac Activity: Present

Heart Rate: 99  bpm

CRL:  3.1  mm   5 w   6 d                  US EDC: 11/11/2017

Subchorionic hemorrhage:  None identified

Maternal uterus/adnexae:

Retroverted uterus.

LEFT ovary normal size and morphology 2.8 x 1.5 x 1.2 cm.

RIGHT ovary normal size and morphology, 2.4 x 1.2 x 1.3 cm.

No adnexal masses or free pelvic fluid.
IMPRESSION: Single live intrauterine gestation at 5 weeks 6 days EGA by
crown-rump length.

No acute abnormalities.

## 2020-04-04 IMAGING — US US OB COMP LESS 14 WK
1 series · 14 of 28 positions shown · non-contrast
Comparison: Prior ultrasound 09/30/2017

CLINICAL DATA: Nausea with pregnancy.

EXAM:
OBSTETRIC <14 WK ULTRASOUND
TECHNIQUE: Transabdominal ultrasound was performed for evaluation of the
gestation as well as the maternal uterus and adnexal regions.

[Series 1: us ob comp less 14 wk · 48 acquisitions, 14 frames shown]
[im 2/48]
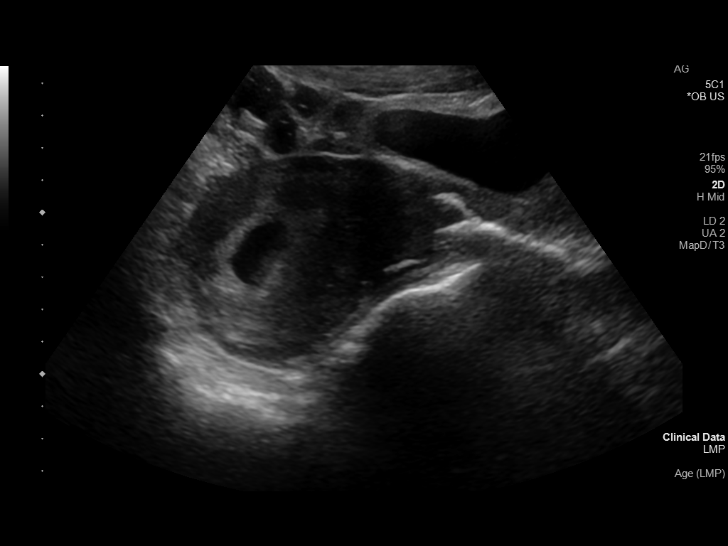
[im 6/48]
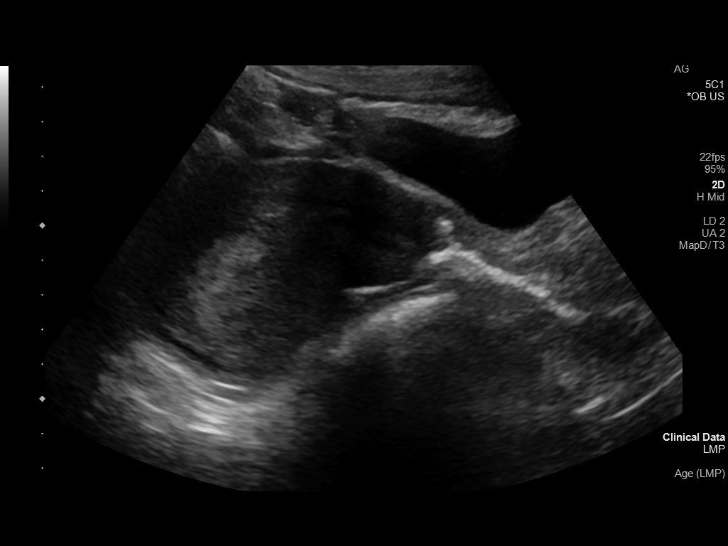
[im 9/48]
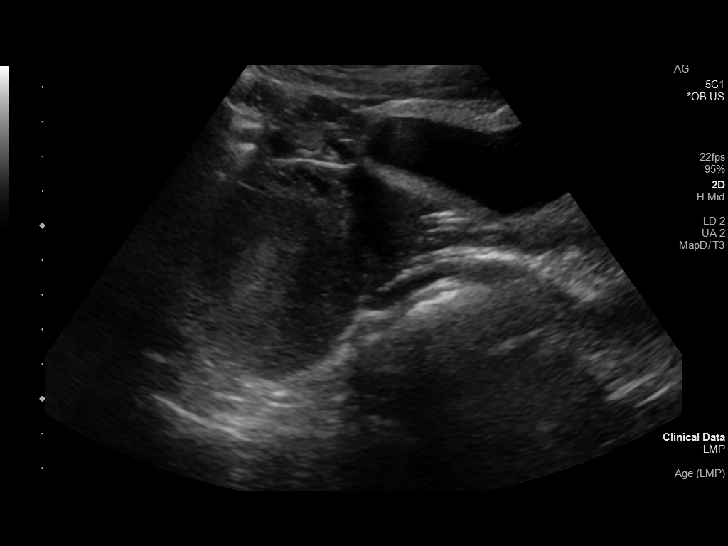
[im 13/48]
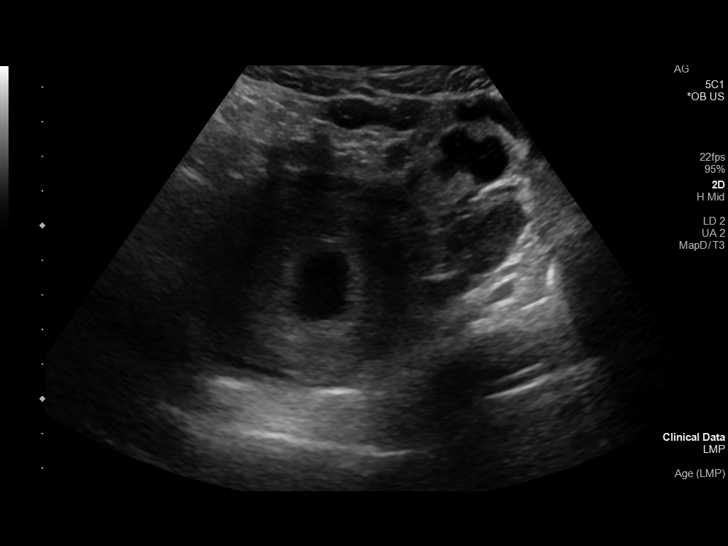
[im 16/48]
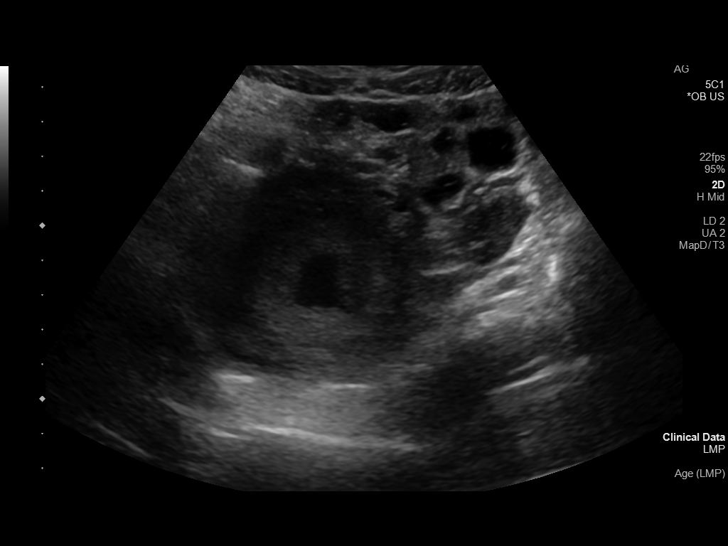
[im 20/48]
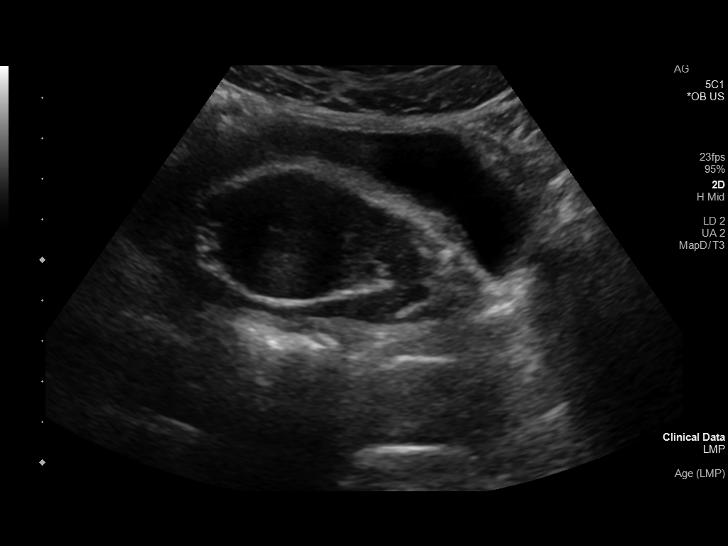
[im 23/48]
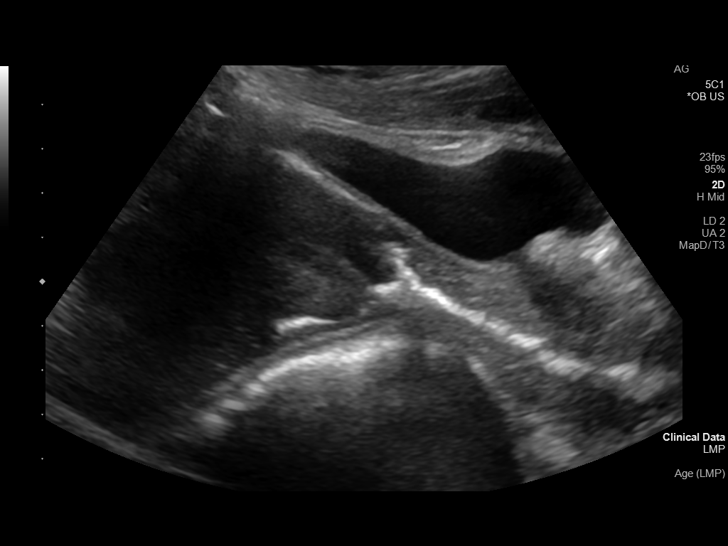
[im 27/48]
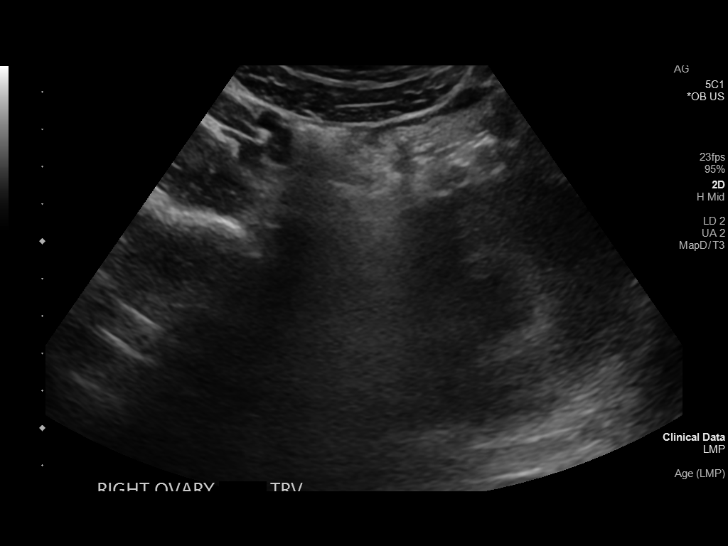
[im 30/48]
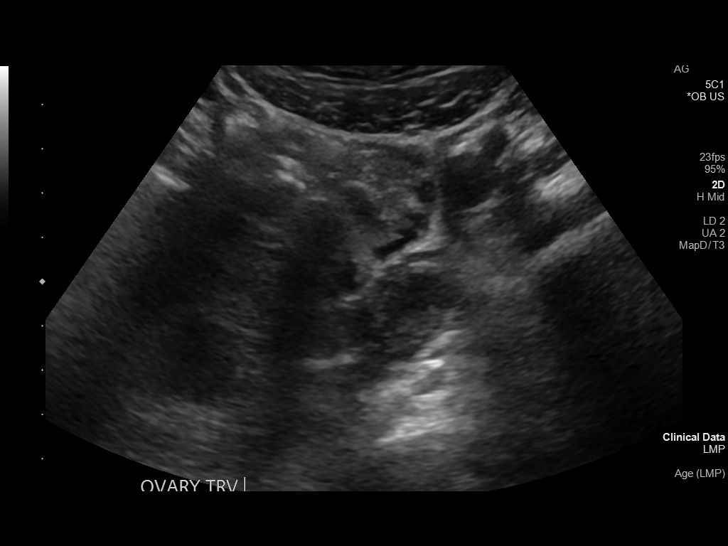
[im 34/48]
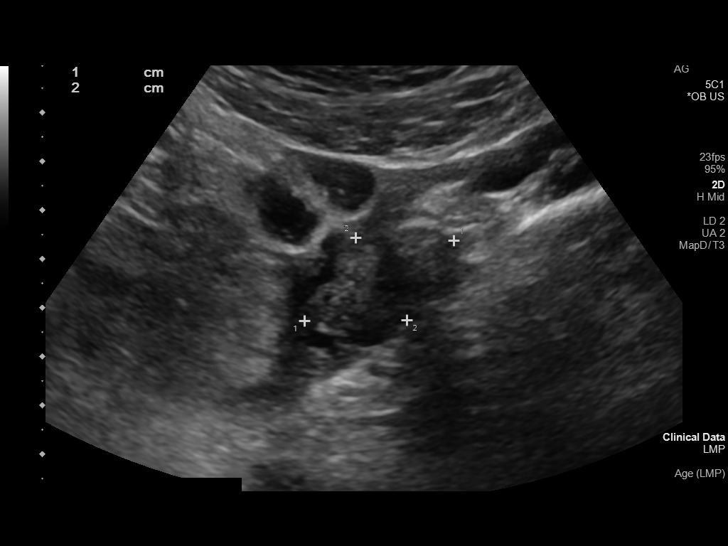
[im 37/48]
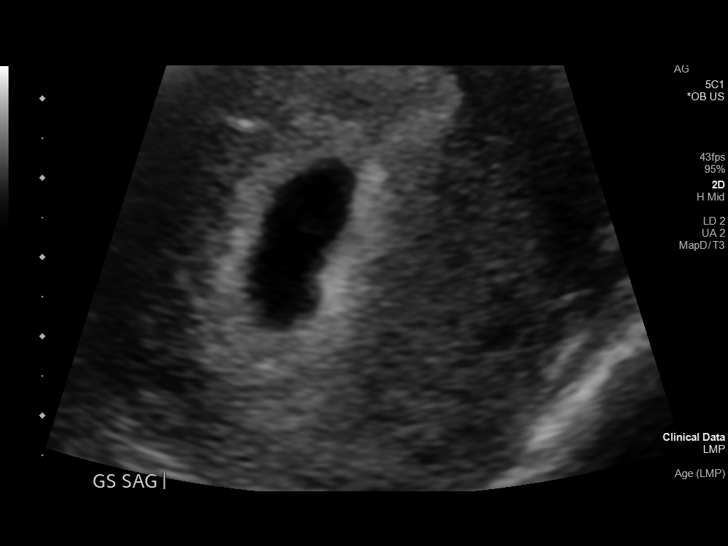
[im 41/48]
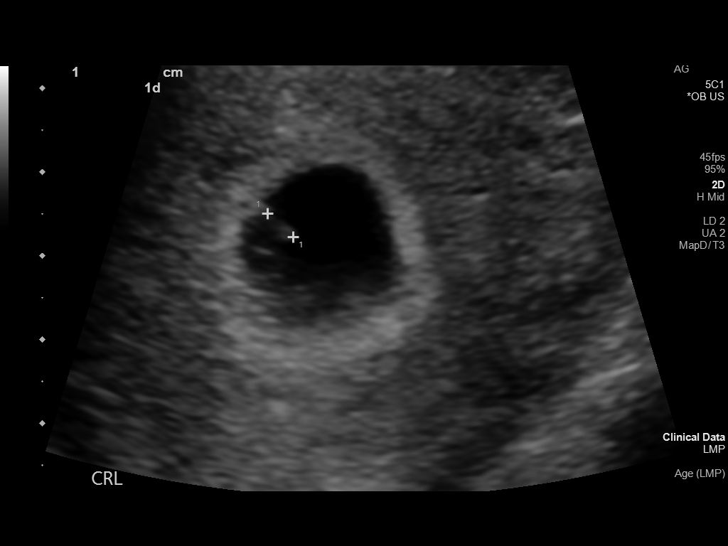
[im 44/48]
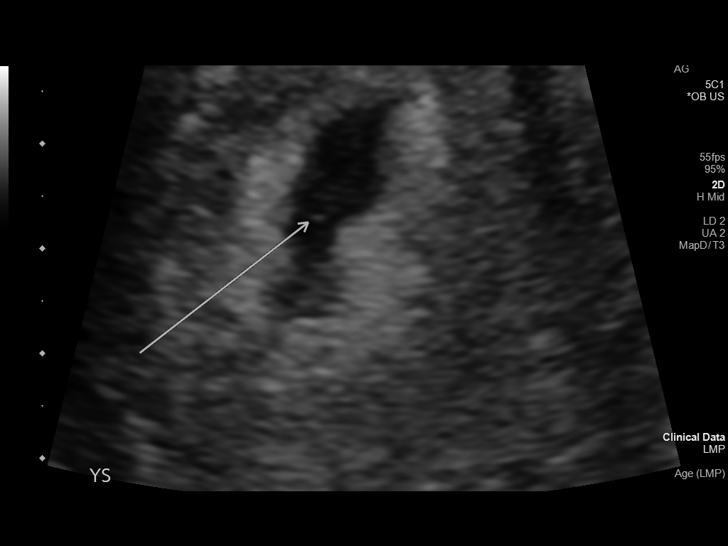
[im 48/48]
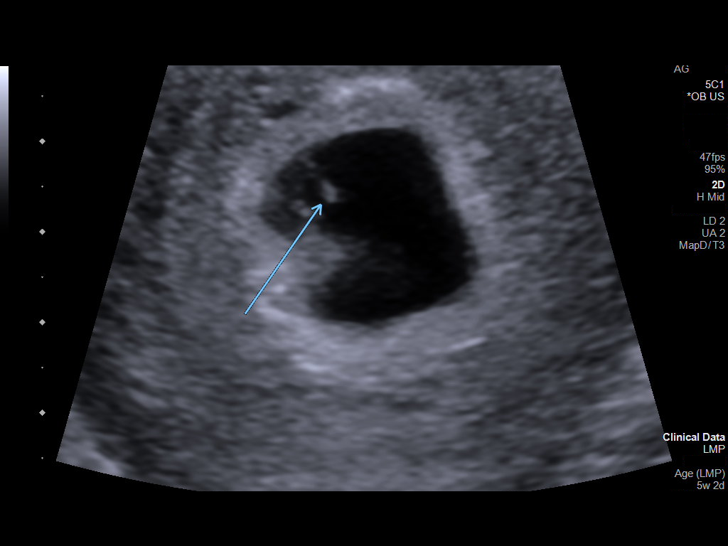

[14 of 28 positions shown; findings below may reference images not displayed]

FINDINGS: Intrauterine gestational sac: Single

Yolk sac:  Present

Embryo:  Present

Cardiac Activity: Present

Heart Rate: 169 bpm

CRL: 5.4 mm   6 w 2 d                  US EDC: 12/04/2018

Subchorionic hemorrhage:  None visualized.

Maternal uterus/adnexae: The right ovary was not seen. Normal
appearance of the left ovary.
IMPRESSION: Single live intrauterine pregnancy corresponding to 6 weeks and 2
days gestation.

## 2021-05-08 ENCOUNTER — Encounter: Payer: Self-pay | Admitting: *Deleted

## 2023-11-15 ENCOUNTER — Encounter (HOSPITAL_COMMUNITY): Payer: Self-pay | Admitting: *Deleted

## 2023-11-15 ENCOUNTER — Other Ambulatory Visit: Payer: Self-pay

## 2023-11-15 ENCOUNTER — Emergency Department (HOSPITAL_COMMUNITY)
Admission: EM | Admit: 2023-11-15 | Discharge: 2023-11-15 | Disposition: A | Discharge status: Home / Self Care | Attending: Emergency Medicine | Admitting: Emergency Medicine

## 2023-11-15 ENCOUNTER — Emergency Department (HOSPITAL_COMMUNITY)

## 2023-11-15 DIAGNOSIS — O209 Hemorrhage in early pregnancy, unspecified: Secondary | ICD-10-CM | POA: Diagnosis present

## 2023-11-15 DIAGNOSIS — Z3A01 Less than 8 weeks gestation of pregnancy: Secondary | ICD-10-CM | POA: Diagnosis not present

## 2023-11-15 DIAGNOSIS — Z87891 Personal history of nicotine dependence: Secondary | ICD-10-CM | POA: Insufficient documentation

## 2023-11-15 DIAGNOSIS — O4690 Antepartum hemorrhage, unspecified, unspecified trimester: Secondary | ICD-10-CM

## 2023-11-15 LAB — CBC
HCT: 36.3 % (ref 36.0–46.0)
Hemoglobin: 12.4 g/dL (ref 12.0–15.0)
MCH: 31.4 pg (ref 26.0–34.0)
MCHC: 34.2 g/dL (ref 30.0–36.0)
MCV: 91.9 fL (ref 80.0–100.0)
Platelets: 323 10*3/uL (ref 150–400)
RBC: 3.95 MIL/uL (ref 3.87–5.11)
RDW: 13.2 % (ref 11.5–15.5)
WBC: 8.3 10*3/uL (ref 4.0–10.5)
nRBC: 0 % (ref 0.0–0.2)

## 2023-11-15 LAB — BASIC METABOLIC PANEL WITH GFR
Anion gap: 6 (ref 5–15)
BUN: 8 mg/dL (ref 6–20)
CO2: 22 mmol/L (ref 22–32)
Calcium: 8.9 mg/dL (ref 8.9–10.3)
Chloride: 109 mmol/L (ref 98–111)
Creatinine, Ser: 0.73 mg/dL (ref 0.44–1.00)
GFR, Estimated: >60 mL/min (ref >60)
Glucose, Bld: 98 mg/dL (ref 70–99)
Potassium: 3.7 mmol/L (ref 3.5–5.1)
Sodium: 137 mmol/L (ref 135–145)

## 2023-11-15 LAB — URINALYSIS, ROUTINE W REFLEX MICROSCOPIC
Bilirubin Urine: NEGATIVE
Glucose, UA: NEGATIVE mg/dL
Hgb urine dipstick: NEGATIVE
Ketones, ur: 5 mg/dL — AB
Leukocytes,Ua: NEGATIVE
Nitrite: NEGATIVE
Protein, ur: 30 mg/dL — AB
Specific Gravity, Urine: 1.031 — ABNORMAL HIGH (ref 1.005–1.030)
pH: 5 (ref 5.0–8.0)

## 2023-11-15 LAB — HCG, QUANTITATIVE, PREGNANCY: hCG, Beta Chain, Quant, S: 3546 m[IU]/mL — ABNORMAL HIGH (ref <5)

## 2023-11-15 LAB — PREGNANCY, URINE: Preg Test, Ur: POSITIVE — AB

## 2023-11-15 LAB — ABO/RH: ABO/RH(D): B POS

## 2023-11-15 NOTE — ED Provider Notes (Signed)
  Physical Exam  BP 105/75 (BP Location: Left Arm)   Pulse 66   Temp 98.2 F (36.8 C) (Oral)   Resp 16   Ht 5\' 10"  (1.778 m)   Wt 77.1 kg   LMP 10/09/2023   SpO2 100%   BMI 24.39 kg/m   Physical Exam  Procedures  Procedures  ED Course / MDM    Medical Decision Making Amount and/or Complexity of Data Reviewed Labs: ordered. Radiology: ordered.   Received signout.  Vaginal bleeding after intercourse while pregnant.  Since bleeding.  Blood work reassuring.  Pregnancy test reassuring.  5 weeks and 2 days pregnant was.  Informed patient will discharge.  Follow-up with her obstetrician.       Tina Arias, MD 11/15/23 [phone]

## 2023-11-15 NOTE — ED Provider Notes (Signed)
 [facility]ERGENCY DEPT [facility] Emergency Department Provider Note MRN:  [id number]  Arrival date & time: 11/15/23     Chief Complaint   Vaginal Bleeding   History of Present Illness   Tina Beck is a 28 y.o. year-old female with no pertinent past medical history presenting to the ED with chief complaint of vaginal bleeding.  Some vaginal spotting after intercourse this morning.  Has since stopped bleeding.  Denies pain.  Currently no symptoms.  Had a positive pregnancy test last week.  Review of Systems  A thorough review of systems was obtained and all systems are negative except as noted in the HPI and PMH.   Patient's Health History    Past Medical History:  Diagnosis Date   Medical history non-contributory     Past Surgical History:  Procedure Laterality Date   MOUTH SURGERY     OPEN REDUCTION INTERNAL FIXATION (ORIF) METACARPAL Right 03/23/2016   Procedure: OPEN REDUCTION INTERNAL FIXATION OF RIGHT 5TH METACARPAL;  Surgeon: Florida Hurter, MD;  Location: [facility];  Service: Orthopedics;  Laterality: Right;    History reviewed. No pertinent family history.  Social History   Socioeconomic History   Marital status: Single    Spouse name: Not on file   Number of children: Not on file   Years of education: Not on file   Highest education level: Not on file  Occupational History   Not on file  Tobacco Use   Smoking status: Former   Smokeless tobacco: Never  Vaping Use   Vaping status: Never Used  Substance and Sexual Activity   Alcohol use: No   Drug use: Not Currently    Types: Marijuana   Sexual activity: Not Currently    Birth control/protection: None  Other Topics Concern   Not on file  Social History Narrative   Not on file   Social Drivers of Health   Financial Resource Strain: Not on file  Food Insecurity: Not on file  Transportation Needs: Not on file  Physical Activity: Not on file  Stress: Not on file  Social  Connections: Not on file  Intimate Partner Violence: Not on file     Physical Exam   Vitals:   11/15/23 0512  BP: 105/75  Pulse: 66  Resp: 16  Temp: 98.2 F (36.8 C)  SpO2: 100%    CONSTITUTIONAL: Well-appearing, NAD NEURO/PSYCH:  Alert and oriented x 3, no focal deficits EYES:  eyes equal and reactive ENT/NECK:  no LAD, no JVD CARDIO: Regular rate, well-perfused, normal S1 and S2 PULM:  CTAB no wheezing or rhonchi GI/GU:  non-distended, non-tender MSK/SPINE:  No gross deformities, no edema SKIN:  no rash, atraumatic   *Additional and/or pertinent findings included in MDM below  Diagnostic and Interventional Summary    EKG Interpretation Date/Time:    Ventricular Rate:    PR Interval:    QRS Duration:    QT Interval:    QTC Calculation:   R Axis:      Text Interpretation:         Labs Reviewed  URINALYSIS, ROUTINE W REFLEX MICROSCOPIC - Abnormal; Notable for the following components:      Result Value   Specific Gravity, Urine 1.031 (*)    Ketones, ur 5 (*)    Protein, ur 30 (*)    Bacteria, UA RARE (*)    All other components within normal limits  PREGNANCY, URINE - Abnormal; Notable for the following components:   Preg Test,  Ur POSITIVE (*)    All other components within normal limits  BASIC METABOLIC PANEL WITH GFR  CBC  HCG, QUANTITATIVE, PREGNANCY  ABO/RH    US  OB Comp < 14 Wks    (Results Pending)  US  OB Transvaginal    (Results Pending)    Medications - No data to display   Procedures  /  Critical Care Procedures  ED Course and Medical Decision Making  Initial Impression and Ddx Ectopic pregnancy is considered versus vaginal bleeding in early pregnancy versus miscarriage.  hCG is positive, obtaining labs, ultrasound.  Past medical/surgical history that increases complexity of ED encounter: None  Interpretation of Diagnostics Labs, ultrasound pending Patient Reassessment and Ultimate Disposition/Management     Signed out to default  provider.  Patient management required discussion with the following services or consulting groups:  None  Complexity of Problems Addressed Acute illness or injury that poses threat of life of bodily function  Additional Data Reviewed and Analyzed Further history obtained from: None  Additional Factors Impacting ED Encounter Risk Consideration of hospitalization  Merrick Abe. Harless Lien, MD [facility] Emergency Medicine [facility] [email or web address]  Final Clinical Impressions(s) / ED Diagnoses     ICD-10-CM   1. Vaginal bleeding in pregnancy  O46.90       ED Discharge Orders     None        Discharge Instructions Discussed with and Provided to Patient:   Discharge Instructions   None      Edson Graces, MD 11/15/23 [phone]

## 2023-11-15 NOTE — Discharge Instructions (Addendum)
 Follow-up with your obstetrician.  You are 5 weeks and 2 days pregnant

## 2023-11-15 NOTE — ED Triage Notes (Signed)
 Pt reports she took a pregnancy test a couple of weeks ago that came up positive  Pt states last night after having sexual intercourse she noticed some bright red blood; pt states the bleeding has stopped at this time and denies any pain

## 2023-11-25 ENCOUNTER — Encounter (HOSPITAL_COMMUNITY): Payer: Self-pay

## 2023-11-25 ENCOUNTER — Emergency Department (HOSPITAL_COMMUNITY)
Admission: EM | Admit: 2023-11-25 | Discharge: 2023-11-25 | Disposition: A | Discharge status: Home / Self Care | Payer: Medicaid - Out of State | Attending: Student | Admitting: Student

## 2023-11-25 ENCOUNTER — Other Ambulatory Visit: Payer: Self-pay

## 2023-11-25 DIAGNOSIS — O210 Mild hyperemesis gravidarum: Secondary | ICD-10-CM | POA: Insufficient documentation

## 2023-11-25 DIAGNOSIS — O219 Vomiting of pregnancy, unspecified: Secondary | ICD-10-CM | POA: Diagnosis present

## 2023-11-25 DIAGNOSIS — Z3A01 Less than 8 weeks gestation of pregnancy: Secondary | ICD-10-CM | POA: Insufficient documentation

## 2023-11-25 DIAGNOSIS — O99281 Endocrine, nutritional and metabolic diseases complicating pregnancy, first trimester: Secondary | ICD-10-CM | POA: Diagnosis not present

## 2023-11-25 DIAGNOSIS — E876 Hypokalemia: Secondary | ICD-10-CM | POA: Insufficient documentation

## 2023-11-25 DIAGNOSIS — Z87891 Personal history of nicotine dependence: Secondary | ICD-10-CM | POA: Insufficient documentation

## 2023-11-25 LAB — COMPREHENSIVE METABOLIC PANEL WITH GFR
ALT: 18 U/L (ref 0–44)
AST: 18 U/L (ref 15–41)
Albumin: 4.4 g/dL (ref 3.5–5.0)
Alkaline Phosphatase: 49 U/L (ref 38–126)
Anion gap: 10 (ref 5–15)
BUN: 7 mg/dL (ref 6–20)
CO2: 19 mmol/L — ABNORMAL LOW (ref 22–32)
Calcium: 9.5 mg/dL (ref 8.9–10.3)
Chloride: 106 mmol/L (ref 98–111)
Creatinine, Ser: 0.68 mg/dL (ref 0.44–1.00)
GFR, Estimated: >60 mL/min (ref >60)
Glucose, Bld: 89 mg/dL (ref 70–99)
Potassium: 3.4 mmol/L — ABNORMAL LOW (ref 3.5–5.1)
Sodium: 135 mmol/L (ref 135–145)
Total Bilirubin: 2.2 mg/dL — ABNORMAL HIGH (ref 0.0–1.2)
Total Protein: 8.4 g/dL — ABNORMAL HIGH (ref 6.5–8.1)

## 2023-11-25 LAB — CBC
HCT: 37.4 % (ref 36.0–46.0)
Hemoglobin: 12.5 g/dL (ref 12.0–15.0)
MCH: 30.2 pg (ref 26.0–34.0)
MCHC: 33.4 g/dL (ref 30.0–36.0)
MCV: 90.3 fL (ref 80.0–100.0)
Platelets: 334 10*3/uL (ref 150–400)
RBC: 4.14 MIL/uL (ref 3.87–5.11)
RDW: 12.8 % (ref 11.5–15.5)
WBC: 9.1 10*3/uL (ref 4.0–10.5)
nRBC: 0 % (ref 0.0–0.2)

## 2023-11-25 LAB — HCG, QUANTITATIVE, PREGNANCY: hCG, Beta Chain, Quant, S: 52055 m[IU]/mL — ABNORMAL HIGH (ref <5)

## 2023-11-25 LAB — LIPASE, BLOOD: Lipase: 33 U/L (ref 11–51)

## 2023-11-25 MED ORDER — DIPHENHYDRAMINE HCL 50 MG/ML IJ SOLN
25.0000 mg | Freq: Once | INTRAMUSCULAR | Status: AC
  Administered 2023-11-25: 25 mg via INTRAVENOUS
  Filled 2023-11-25: qty 1

## 2023-11-25 MED ORDER — PYRIDOXINE HCL 100 MG/ML IJ SOLN
100.0000 mg | Freq: Every day | INTRAMUSCULAR | Status: DC
  Administered 2023-11-25: 100 mg via INTRAVENOUS
  Filled 2023-11-25 (×2): qty 1

## 2023-11-25 MED ORDER — POTASSIUM CHLORIDE CRYS ER 20 MEQ PO TBCR
40.0000 meq | EXTENDED_RELEASE_TABLET | Freq: Once | ORAL | Status: AC
  Administered 2023-11-25: 40 meq via ORAL
  Filled 2023-11-25: qty 2

## 2023-11-25 MED ORDER — LACTATED RINGERS IV BOLUS
1000.0000 mL | Freq: Once | INTRAVENOUS | Status: AC
  Administered 2023-11-25: 1000 mL via INTRAVENOUS

## 2023-11-25 MED ORDER — MAGNESIUM OXIDE -MG SUPPLEMENT 400 (240 MG) MG PO TABS
800.0000 mg | ORAL_TABLET | Freq: Once | ORAL | Status: AC
  Administered 2023-11-25: 800 mg via ORAL
  Filled 2023-11-25: qty 2

## 2023-11-25 MED ORDER — ONDANSETRON 4 MG PO TBDP: 4.0000 mg | ORAL_TABLET | Freq: Three times a day (TID) | ORAL | 0 refills | Status: DC | PRN

## 2023-11-25 MED ORDER — PROCHLORPERAZINE EDISYLATE 10 MG/2ML IJ SOLN
10.0000 mg | Freq: Once | INTRAMUSCULAR | Status: AC
  Administered 2023-11-25: 10 mg via INTRAVENOUS
  Filled 2023-11-25: qty 2

## 2023-11-25 NOTE — ED Provider Notes (Signed)
 [place] EMERGENCY DEPARTMENT AT [facility] Provider Note  CSN: [id number] Arrival date & time: 11/25/23 1539  Chief Complaint(s) Emesis, Weakness, poor appetite, and Abdominal Pain (Pregnant )  HPI Tina Beck is a 28 y.o. female approximately [redacted] weeks pregnant who presents emergency room for evaluation of nausea, vomiting ability tolerate p.o.  States that for the last week she has had trouble tolerating p.o. and has not had anything by mouth for the last 3 days.  Denies associated abdominal pain or vaginal bleeding.  Denies chest pain, shortness of breath, headache, fever or other systemic symptoms.   Past Medical History Past Medical History:  Diagnosis Date   Medical history non-contributory    Patient Active Problem List   Diagnosis Date Noted   Chlamydia 03/22/2017   Missed abortion 10/14/2015   Home Medication(s) Prior to Admission medications   Medication Sig Start Date End Date Taking? Authorizing Provider  ondansetron  (ZOFRAN -ODT) 4 MG disintegrating tablet Take 1 tablet (4 mg total) by mouth every 8 (eight) hours as needed for nausea or vomiting. 11/25/23  Yes Nancy Arvin, MD  cyclobenzaprine  (FLEXERIL ) 10 MG tablet Take 1 tablet (10 mg total) by mouth 2 (two) times daily as needed for muscle spasms. 09/03/18   Agatha Alcon, CNM  ondansetron  (ZOFRAN  ODT) 4 MG disintegrating tablet Take 1 tablet (4 mg total) by mouth every 8 (eight) hours as needed. Patient not taking: Reported on 09/03/2018 04/12/18   Sueellen Emery, MD  Prenatal Multivit-Min-Fe-FA (PRENATAL VITAMINS) 0.8 MG tablet Take 1 tablet by mouth daily. Patient not taking: Reported on 09/03/2018 04/12/18   Sueellen Emery, MD  terconazole  (TERAZOL 7 ) 0.4 % vaginal cream Place 1 applicator vaginally at bedtime. 09/03/18   Agatha Alcon, CNM                                                                                                                                    Past Surgical  History Past Surgical History:  Procedure Laterality Date   MOUTH SURGERY     OPEN REDUCTION INTERNAL FIXATION (ORIF) METACARPAL Right 03/23/2016   Procedure: OPEN REDUCTION INTERNAL FIXATION OF RIGHT 5TH METACARPAL;  Surgeon: Florida Hurter, MD;  Location: [facility];  Service: Orthopedics;  Laterality: Right;   Family History History reviewed. No pertinent family history.  Social History Social History   Tobacco Use   Smoking status: Former   Smokeless tobacco: Never  Advertising account planner   Vaping status: Never Used  Substance Use Topics   Alcohol use: No   Drug use: Yes    Types: Marijuana   Allergies Penicillins  Review of Systems Review of Systems  Gastrointestinal:  Positive for nausea and vomiting.    Physical Exam Vital Signs  I have reviewed the triage vital signs BP 106/66   Pulse 61   Temp 98.3 F (36.8 C) (Oral)   Resp 20   Ht 5'  10" (1.778 m)   Wt 78.3 kg   LMP 10/09/2023   SpO2 99%   BMI 24.77 kg/m   Physical Exam Vitals and nursing note reviewed.  Constitutional:      General: She is not in acute distress.    Appearance: She is well-developed.  HENT:     Head: Normocephalic and atraumatic.  Eyes:     Conjunctiva/sclera: Conjunctivae normal.  Cardiovascular:     Rate and Rhythm: Normal rate and regular rhythm.     Heart sounds: No murmur heard. Pulmonary:     Effort: Pulmonary effort is normal. No respiratory distress.     Breath sounds: Normal breath sounds.  Abdominal:     Palpations: Abdomen is soft.     Tenderness: There is no abdominal tenderness.  Musculoskeletal:        General: No swelling.     Cervical back: Neck supple.  Skin:    General: Skin is warm and dry.     Capillary Refill: Capillary refill takes less than 2 seconds.  Neurological:     Mental Status: She is alert.  Psychiatric:        Mood and Affect: Mood normal.     ED Results and Treatments Labs (all labs ordered are listed, but only abnormal  results are displayed) Labs Reviewed  COMPREHENSIVE METABOLIC PANEL WITH GFR - Abnormal; Notable for the following components:      Result Value   Potassium 3.4 (*)    CO2 19 (*)    Total Protein 8.4 (*)    Total Bilirubin 2.2 (*)    All other components within normal limits  HCG, QUANTITATIVE, PREGNANCY - Abnormal; Notable for the following components:   hCG, Beta Chain, Quant, S 52,055 (*)    All other components within normal limits  LIPASE, BLOOD  CBC                                                                                                                          Radiology No results found.  Pertinent labs & imaging results that were available during my care of the patient were reviewed by me and considered in my medical decision making (see MDM for details).  Medications Ordered in ED Medications  prochlorperazine (COMPAZINE) injection 10 mg (10 mg Intravenous Given 11/25/23 1644)  diphenhydrAMINE (BENADRYL) injection 25 mg (25 mg Intravenous Given 11/25/23 1644)  lactated ringers  bolus 1,000 mL (0 mLs Intravenous Stopped 11/25/23 1821)  potassium chloride SA (KLOR-CON M) CR tablet 40 mEq (40 mEq Oral Given 11/25/23 1817)  magnesium oxide (MAG-OX) tablet 800 mg (800 mg Oral Given 11/25/23 1817)  Procedures Procedures  (including critical care time)  Medical Decision Making / ED Course   This patient presents to the ED for concern of vomiting in pregnancy, this involves an extensive number of treatment options, and is a complaint that carries with it a high risk of complications and morbidity.  The differential diagnosis includes hyperemesis gravidarum, electrolyte abnormality, dehydration,  MDM: Patient seen emergency room for evaluation of vomiting in pregnancy.  Physical exam is largely unremarkable with no appreciable abdominal  tenderness to palpation, abdomen soft benign and nontender.  Laboratory evaluation with some mild hypokalemia to 3.4 which was repleted here in the emergency department.  Beta-hCG 52,055.  Patient has had a recent ultrasound 10 days ago that confirms IUP and given lack of abdominal tenderness or vaginal bleeding we will defer additional ultrasonography here in the Emergency Department.  Patient given Compazine, Benadryl and pyridoxine and on reevaluation she is able to tolerate p.o. without difficulty with symptoms significant improved.  At this time she does not meet inpatient criteria for admission and will be discharged with outpatient follow-up.  Return precautions given which she voiced understanding.   Additional history obtained: -External records from outside source obtained and reviewed including: Chart review including previous notes, labs, imaging, consultation notes   Lab Tests: -I ordered, reviewed, and interpreted labs.   The pertinent results include:   Labs Reviewed  COMPREHENSIVE METABOLIC PANEL WITH GFR - Abnormal; Notable for the following components:      Result Value   Potassium 3.4 (*)    CO2 19 (*)    Total Protein 8.4 (*)    Total Bilirubin 2.2 (*)    All other components within normal limits  HCG, QUANTITATIVE, PREGNANCY - Abnormal; Notable for the following components:   hCG, Beta Chain, Quant, S 52,055 (*)    All other components within normal limits  LIPASE, BLOOD  CBC       Medicines ordered and prescription drug management: Meds ordered this encounter  Medications   prochlorperazine (COMPAZINE) injection 10 mg   diphenhydrAMINE (BENADRYL) injection 25 mg   DISCONTD: pyridOXINE (VITAMIN B6) injection 100 mg   lactated ringers  bolus 1,000 mL   potassium chloride SA (KLOR-CON M) CR tablet 40 mEq   magnesium oxide (MAG-OX) tablet 800 mg   ondansetron  (ZOFRAN -ODT) 4 MG disintegrating tablet    Sig: Take 1 tablet (4 mg total) by mouth every 8 (eight) hours  as needed for nausea or vomiting.    Dispense:  20 tablet    Refill:  0    -I have reviewed the patients home medicines and have made adjustments as needed  Critical interventions none    Cardiac Monitoring: The patient was maintained on a cardiac monitor.  I personally viewed and interpreted the cardiac monitored which showed an underlying rhythm of: NSR  Social Determinants of Health:  Factors impacting patients care include: none   Reevaluation: After the interventions noted above, I reevaluated the patient and found that they have :improved  Co morbidities that complicate the patient evaluation  Past Medical History:  Diagnosis Date   Medical history non-contributory       Dispostion: I considered admission for this patient, time as she is tolerating p.o. she does not meet inpatient criteria for admission will be discharged outpatient follow-up     Final Clinical Impression(s) / ED Diagnoses Final diagnoses:  Hyperemesis gravidarum     @PCDICTATION @    Karlyn Overman, MD 11/26/23 [phone]

## 2023-11-25 NOTE — ED Notes (Addendum)
 Pt informed of need for urine sample; pt stated she can't provide one right now

## 2023-11-25 NOTE — ED Triage Notes (Signed)
 Pt reports:  Pregnant Unknown weeks pregnant  Vomiting Started 1 week ago Poor Appetite Unable to keep anything down "I'm hungry, I'm thirsty, I'm dehydrated."  Weakness Due to not eating Smoking Weed Suddenly stopped last week Started back this week due to the nausea/vomiting

## 2023-12-03 ENCOUNTER — Encounter (HOSPITAL_COMMUNITY): Payer: Self-pay | Admitting: Obstetrics and Gynecology

## 2023-12-03 ENCOUNTER — Inpatient Hospital Stay (HOSPITAL_COMMUNITY)
Admission: AD | Admit: 2023-12-03 | Discharge: 2023-12-03 | Disposition: A | Discharge status: Home / Self Care | Attending: Obstetrics and Gynecology | Admitting: Obstetrics and Gynecology

## 2023-12-03 DIAGNOSIS — O211 Hyperemesis gravidarum with metabolic disturbance: Secondary | ICD-10-CM | POA: Diagnosis not present

## 2023-12-03 DIAGNOSIS — M5459 Other low back pain: Secondary | ICD-10-CM | POA: Diagnosis present

## 2023-12-03 DIAGNOSIS — R1110 Vomiting, unspecified: Secondary | ICD-10-CM

## 2023-12-03 DIAGNOSIS — O219 Vomiting of pregnancy, unspecified: Secondary | ICD-10-CM | POA: Diagnosis present

## 2023-12-03 DIAGNOSIS — O26891 Other specified pregnancy related conditions, first trimester: Secondary | ICD-10-CM | POA: Diagnosis not present

## 2023-12-03 DIAGNOSIS — Z3A01 Less than 8 weeks gestation of pregnancy: Secondary | ICD-10-CM

## 2023-12-03 LAB — COMPREHENSIVE METABOLIC PANEL WITH GFR
ALT: 25 U/L (ref 0–44)
AST: 21 U/L (ref 15–41)
Albumin: 4.6 g/dL (ref 3.5–5.0)
Alkaline Phosphatase: 45 U/L (ref 38–126)
Anion gap: 16 — ABNORMAL HIGH (ref 5–15)
BUN: 10 mg/dL (ref 6–20)
CO2: 19 mmol/L — ABNORMAL LOW (ref 22–32)
Calcium: 9.9 mg/dL (ref 8.9–10.3)
Chloride: 101 mmol/L (ref 98–111)
Creatinine, Ser: 0.73 mg/dL (ref 0.44–1.00)
GFR, Estimated: >60 mL/min (ref >60)
Glucose, Bld: 102 mg/dL — ABNORMAL HIGH (ref 70–99)
Potassium: 3.7 mmol/L (ref 3.5–5.1)
Sodium: 136 mmol/L (ref 135–145)
Total Bilirubin: 2.6 mg/dL — ABNORMAL HIGH (ref 0.0–1.2)
Total Protein: 8.5 g/dL — ABNORMAL HIGH (ref 6.5–8.1)

## 2023-12-03 LAB — CBC WITH DIFFERENTIAL/PLATELET
Abs Immature Granulocytes: 0.03 10*3/uL (ref 0.00–0.07)
Basophils Absolute: 0 10*3/uL (ref 0.0–0.1)
Basophils Relative: 0 %
Eosinophils Absolute: 0 10*3/uL (ref 0.0–0.5)
Eosinophils Relative: 0 %
HCT: 45.6 % (ref 36.0–46.0)
Hemoglobin: 16 g/dL — ABNORMAL HIGH (ref 12.0–15.0)
Immature Granulocytes: 0 %
Lymphocytes Relative: 19 %
Lymphs Abs: 2.1 10*3/uL (ref 0.7–4.0)
MCH: 30.6 pg (ref 26.0–34.0)
MCHC: 35.1 g/dL (ref 30.0–36.0)
MCV: 87.2 fL (ref 80.0–100.0)
Monocytes Absolute: 0.7 10*3/uL (ref 0.1–1.0)
Monocytes Relative: 6 %
Neutro Abs: 8.3 10*3/uL — ABNORMAL HIGH (ref 1.7–7.7)
Neutrophils Relative %: 75 %
Platelets: 440 10*3/uL — ABNORMAL HIGH (ref 150–400)
RBC: 5.23 MIL/uL — ABNORMAL HIGH (ref 3.87–5.11)
RDW: 12.3 % (ref 11.5–15.5)
WBC: 11.3 10*3/uL — ABNORMAL HIGH (ref 4.0–10.5)
nRBC: 0 % (ref 0.0–0.2)

## 2023-12-03 LAB — URINALYSIS, ROUTINE W REFLEX MICROSCOPIC
Bilirubin Urine: NEGATIVE
Glucose, UA: NEGATIVE mg/dL
Hgb urine dipstick: NEGATIVE
Ketones, ur: 80 mg/dL — AB
Leukocytes,Ua: NEGATIVE
Nitrite: NEGATIVE
Protein, ur: 30 mg/dL — AB
Specific Gravity, Urine: 1.028 (ref 1.005–1.030)
pH: 5 (ref 5.0–8.0)

## 2023-12-03 LAB — LIPASE, BLOOD: Lipase: 42 U/L (ref 11–51)

## 2023-12-03 MED ORDER — LACTATED RINGERS IV BOLUS
1000.0000 mL | Freq: Once | INTRAVENOUS | Status: AC
  Administered 2023-12-03: 1000 mL via INTRAVENOUS

## 2023-12-03 MED ORDER — SCOPOLAMINE 1 MG/3DAYS TD PT72: 1.0000 | MEDICATED_PATCH | TRANSDERMAL | 12 refills | Status: DC

## 2023-12-03 MED ORDER — FAMOTIDINE IN NACL 20-0.9 MG/50ML-% IV SOLN
20.0000 mg | Freq: Once | INTRAVENOUS | Status: AC
  Administered 2023-12-03: 20 mg via INTRAVENOUS
  Filled 2023-12-03: qty 50

## 2023-12-03 MED ORDER — SCOPOLAMINE 1 MG/3DAYS TD PT72
1.0000 | MEDICATED_PATCH | TRANSDERMAL | Status: DC
  Administered 2023-12-03: 1.5 mg via TRANSDERMAL
  Filled 2023-12-03: qty 1

## 2023-12-03 MED ORDER — SODIUM CHLORIDE 0.9 % IV SOLN
8.0000 mg | Freq: Once | INTRAVENOUS | Status: AC
  Administered 2023-12-03: 8 mg via INTRAVENOUS
  Filled 2023-12-03: qty 4

## 2023-12-03 MED ORDER — ONDANSETRON 8 MG PO TBDP: 8.0000 mg | ORAL_TABLET | Freq: Three times a day (TID) | ORAL | 1 refills | Status: DC | PRN

## 2023-12-03 MED ORDER — PROCHLORPERAZINE MALEATE 10 MG PO TABS: 10.0000 mg | ORAL_TABLET | Freq: Four times a day (QID) | ORAL | 0 refills | Status: DC | PRN

## 2023-12-03 MED ORDER — PROCHLORPERAZINE EDISYLATE 10 MG/2ML IJ SOLN
10.0000 mg | Freq: Once | INTRAMUSCULAR | Status: AC
  Administered 2023-12-03: 10 mg via INTRAVENOUS
  Filled 2023-12-03: qty 2

## 2023-12-03 NOTE — MAU Provider Note (Signed)
 History     CSN: [id number]  Arrival date and time: 12/03/23 1426   Event Date/Time   First Provider Initiated Contact with Patient 12/03/23 1458      Chief Complaint  Patient presents with   Nausea   Emesis   HPI  Ms.Norena K Winer is a 28 y.o. female [phone] @ [redacted]w[redacted]d here with N/V. Reports a 12 lbs weight loss.  Has not eaten anything in 5 days. Has Zofran  at home however stopped taking it because it does not work. She had a viability [facility]  Which was normal. She reports off and on vomiting all day. She denies regular THC use. Denies abdominal pain or the urge to sit in hot showers, no other symptoms suggestive of cannabis hyperemesis.   OB History     Gravida  4   Para  2   Term  2   Preterm      AB  1   Living  2      SAB  1   IAB      Ectopic      Multiple      Live Births  2           Past Medical History:  Diagnosis Date   Medical history non-contributory     Past Surgical History:  Procedure Laterality Date   MOUTH SURGERY     OPEN REDUCTION INTERNAL FIXATION (ORIF) METACARPAL Right 03/23/2016   Procedure: OPEN REDUCTION INTERNAL FIXATION OF RIGHT 5TH METACARPAL;  Surgeon: Florida Hurter, MD;  Location: [facility];  Service: Orthopedics;  Laterality: Right;    No family history on file.  Social History   Tobacco Use   Smoking status: Former   Smokeless tobacco: Never  Advertising account planner   Vaping status: Never Used  Substance Use Topics   Alcohol use: No   Drug use: Not Currently    Types: Marijuana    Allergies:  Allergies  Allergen Reactions   Penicillins Hives and Itching    Has patient had a PCN reaction causing immediate rash, facial/tongue/throat swelling, SOB or lightheadedness with hypotension: No Has patient had a PCN reaction causing severe rash involving mucus membranes or skin necrosis: No Has patient had a PCN reaction that required hospitalization: No Has patient had a PCN reaction occurring within the  last 10 years: Yes If all of the above answers are "NO", then may proceed with Cephalosporin use.     Medications Prior to Admission  Medication Sig Dispense Refill Last Dose/Taking   ondansetron  (ZOFRAN -ODT) 4 MG disintegrating tablet Take 1 tablet (4 mg total) by mouth every 8 (eight) hours as needed for nausea or vomiting. 20 tablet 0 Past Week   cyclobenzaprine  (FLEXERIL ) 10 MG tablet Take 1 tablet (10 mg total) by mouth 2 (two) times daily as needed for muscle spasms. 10 tablet 0    ondansetron  (ZOFRAN  ODT) 4 MG disintegrating tablet Take 1 tablet (4 mg total) by mouth every 8 (eight) hours as needed. (Patient not taking: Reported on 09/03/2018) 10 tablet 0    Prenatal Multivit-Min-Fe-FA (PRENATAL VITAMINS) 0.8 MG tablet Take 1 tablet by mouth daily. (Patient not taking: Reported on 09/03/2018) 30 tablet 0    terconazole  (TERAZOL 7 ) 0.4 % vaginal cream Place 1 applicator vaginally at bedtime. 45 g 0    Results for orders placed or performed during the hospital encounter of 12/03/23 (from the past 48 hours)  CBC with Differential/Platelet     Status: Abnormal   Collection  Time: 12/03/23  3:18 PM  Result Value Ref Range   WBC 11.3 (H) 4.0 - 10.5 K/uL   RBC 5.23 (H) 3.87 - 5.11 MIL/uL   Hemoglobin 16.0 (H) 12.0 - 15.0 g/dL   HCT 86.5 78.4 - 69.6 %   MCV 87.2 80.0 - 100.0 fL   MCH 30.6 26.0 - 34.0 pg   MCHC 35.1 30.0 - 36.0 g/dL   RDW 29.5 28.4 - 13.2 %   Platelets 440 (H) 150 - 400 K/uL   nRBC 0.0 0.0 - 0.2 %   Neutrophils Relative % 75 %   Neutro Abs 8.3 (H) 1.7 - 7.7 K/uL   Lymphocytes Relative 19 %   Lymphs Abs 2.1 0.7 - 4.0 K/uL   Monocytes Relative 6 %   Monocytes Absolute 0.7 0.1 - 1.0 K/uL   Eosinophils Relative 0 %   Eosinophils Absolute 0.0 0.0 - 0.5 K/uL   Basophils Relative 0 %   Basophils Absolute 0.0 0.0 - 0.1 K/uL   Immature Granulocytes 0 %   Abs Immature Granulocytes 0.03 0.00 - 0.07 K/uL    Comment: Performed at [facility], [address]  [place], [place] [address]  Comprehensive metabolic panel     Status: Abnormal   Collection Time: 12/03/23  3:18 PM  Result Value Ref Range   Sodium 136 135 - 145 mmol/L   Potassium 3.7 3.5 - 5.1 mmol/L   Chloride 101 98 - 111 mmol/L   CO2 19 (L) 22 - 32 mmol/L   Glucose, Bld 102 (H) 70 - 99 mg/dL    Comment: Glucose reference range applies only to samples taken after fasting for at least 8 hours.   BUN 10 6 - 20 mg/dL   Creatinine, Ser 2.72 0.44 - 1.00 mg/dL   Calcium 9.9 8.9 - 53.6 mg/dL   Total Protein 8.5 (H) 6.5 - 8.1 g/dL   Albumin 4.6 3.5 - 5.0 g/dL   AST 21 15 - 41 U/L   ALT 25 0 - 44 U/L   Alkaline Phosphatase 45 38 - 126 U/L   Total Bilirubin 2.6 (H) 0.0 - 1.2 mg/dL   GFR, Estimated >64 >40 mL/min    Comment: (NOTE) Calculated using the CKD-EPI Creatinine Equation (2021)    Anion gap 16 (H) 5 - 15    Comment: Performed at [facility], [address], [place], [place] [address]  Lipase, blood     Status: None   Collection Time: 12/03/23  3:18 PM  Result Value Ref Range   Lipase 42 11 - 51 U/L    Comment: Performed at [facility], [address], [place], [place] [address]  Urinalysis, Routine w reflex microscopic -Urine, Clean Catch     Status: Abnormal   Collection Time: 12/03/23  3:18 PM  Result Value Ref Range   Color, Urine YELLOW YELLOW   APPearance HAZY (A) CLEAR   Specific Gravity, Urine 1.028 1.005 - 1.030   pH 5.0 5.0 - 8.0   Glucose, UA NEGATIVE NEGATIVE mg/dL   Hgb urine dipstick NEGATIVE NEGATIVE   Bilirubin Urine NEGATIVE NEGATIVE   Ketones, ur 80 (A) NEGATIVE mg/dL   Protein, ur 30 (A) NEGATIVE mg/dL   Nitrite NEGATIVE NEGATIVE   Leukocytes,Ua NEGATIVE NEGATIVE   RBC / HPF 0-5 0 - 5 RBC/hpf   WBC, UA 0-5 0 - 5 WBC/hpf   Bacteria, UA FEW (A) NONE SEEN   Squamous Epithelial / HPF 0-5 0 - 5 /  HPF   Mucus PRESENT     Comment: Performed at [facility], [address], [place], [place] [address]     Review of Systems   Gastrointestinal:  Positive for nausea and vomiting. Negative for constipation and diarrhea.   Physical Exam   Blood pressure 116/84, pulse 84, temperature 97.6 F (36.4 C), resp. rate 20, height 5\' 10"  (1.778 m), weight 72.6 kg, last menstrual period 10/09/2023, unknown if currently breastfeeding.  Physical Exam Constitutional:      General: She is not in acute distress.    Appearance: Normal appearance. She is ill-appearing.  HENT:     Head: Normocephalic.  Skin:    General: Skin is warm.  Neurological:     Mental Status: She is alert and oriented to person, place, and time.  Psychiatric:        Behavior: Behavior normal.    MAU Course  Procedures  MDM  2 liters of LR given CBC, CMP, and lipase UA shows severe dehydration. Weights in MAU due confirm 12 lb weight loss Scope patch placed, Zofran  8 mg IV, Pepcid Iv and compazine IV Immediately following compazine injections the patient requests DC home. She tolerated crackers prior to DC home. Declined to stay in MAU any longer.   Assessment and Plan   A:  1. Hyperemesis      P:  Dc home Low threshold for admit if patient returns given significant weight loss. Rx: Compazine, Zofran  and scope patch Small, frequent meals  Alper Guilmette, Juliette Oh, NP 12/03/2023 7:37 PM

## 2023-12-03 NOTE — MAU Note (Addendum)
..  Tina Beck is a 28 y.o. at Unknown here in MAU reporting: increased nausea and vomiting. Unable to keeps foods down for last 3 days. Has only been able to drink a couple bottles of water . Emesis x 6.   Last took zofran  12/01/23. Stopped taking it because it hasn't been helping.   Has also had 9/10 lower back pain for last two days.   Has lost 12 pounds in the last week.   LMP: 10/09/23 Onset of complaint: ongoing throughout last few weeks but worse over last three days.  Pain score: 9/10 Vitals:   12/03/23 1439  BP: 116/84  Pulse: 84  Resp: 20  Temp: 97.6 F (36.4 C)     FHT:na Lab orders placed from triage:  UA

## 2023-12-17 ENCOUNTER — Other Ambulatory Visit: Payer: Self-pay

## 2023-12-17 ENCOUNTER — Emergency Department (HOSPITAL_COMMUNITY)
Admission: EM | Admit: 2023-12-17 | Discharge: 2023-12-17 | Disposition: A | Discharge status: Home / Self Care | Attending: Emergency Medicine | Admitting: Emergency Medicine

## 2023-12-17 ENCOUNTER — Encounter (HOSPITAL_COMMUNITY): Payer: Self-pay

## 2023-12-17 DIAGNOSIS — Z3A09 9 weeks gestation of pregnancy: Secondary | ICD-10-CM | POA: Insufficient documentation

## 2023-12-17 DIAGNOSIS — O26891 Other specified pregnancy related conditions, first trimester: Secondary | ICD-10-CM | POA: Diagnosis present

## 2023-12-17 DIAGNOSIS — O23591 Infection of other part of genital tract in pregnancy, first trimester: Secondary | ICD-10-CM | POA: Insufficient documentation

## 2023-12-17 DIAGNOSIS — B9689 Other specified bacterial agents as the cause of diseases classified elsewhere: Secondary | ICD-10-CM | POA: Diagnosis not present

## 2023-12-17 LAB — URINALYSIS, ROUTINE W REFLEX MICROSCOPIC
Bilirubin Urine: NEGATIVE
Glucose, UA: 50 mg/dL — AB
Hgb urine dipstick: NEGATIVE
Ketones, ur: NEGATIVE mg/dL
Nitrite: NEGATIVE
Protein, ur: 100 mg/dL — AB
Specific Gravity, Urine: 1.033 — ABNORMAL HIGH (ref 1.005–1.030)
pH: 5 (ref 5.0–8.0)

## 2023-12-17 LAB — WET PREP, GENITAL
Sperm: NONE SEEN
Trich, Wet Prep: NONE SEEN
Yeast Wet Prep HPF POC: NONE SEEN

## 2023-12-17 MED ORDER — METRONIDAZOLE 500 MG PO TABS: 500.0000 mg | ORAL_TABLET | Freq: Two times a day (BID) | ORAL | 0 refills | Status: DC

## 2023-12-17 NOTE — ED Triage Notes (Signed)
 Pt is [redacted] wks pregnant and stated that she is having abdominal cramping. Also wants STD check

## 2023-12-17 NOTE — Discharge Instructions (Signed)
 See your OBGYN this week in follow up Your testing shows that you have BV - this is a bacteria that requires certain antibiotics to cure.  Flagyl  twice a day for 7 days  Thank you for allowing us  to treat you in the emergency department today.  After reviewing your examination and potential testing that was done it appears that you are safe to go home.  I would like for you to follow-up with your doctor within the next several days, have them obtain your records and follow-up with them to review all potential tests and results from your visit.  If you should develop severe or worsening symptoms return to the emergency department immediately

## 2023-12-17 NOTE — ED Provider Notes (Signed)
 [place] EMERGENCY DEPARTMENT AT [facility] Provider Note   CSN: [id number] Arrival date & time: 12/17/23  1224     Patient presents with: Abdominal Cramping and STI check    Tina Beck is a 28 y.o. female.    Abdominal Cramping   This patient is a 28 year old female G4 P2-0-1-2 at approximately 9 weeks 3 days gestation presenting with some mild lower abdominal cramping but concerned with some white vaginal discharge and concern for STD.  The patient reports she has had an STD before, she denies having any dysuria suprapubic pain or back pain or fevers or chills.  She states I want to be checked for STDs.  Review of the medical record shows that she was actually seen in the maternal admissions unit about 2 weeks ago when she was treated for hyperemesis and dehydration.  Since that time she has been wearing a scopolamine  patch and doing very well    Prior to Admission medications   Medication Sig Start Date End Date Taking? Authorizing Provider  metroNIDAZOLE  (FLAGYL ) 500 MG tablet Take 1 tablet (500 mg total) by mouth 2 (two) times daily. 12/17/23  Yes Early Glisson, MD  cyclobenzaprine  (FLEXERIL ) 10 MG tablet Take 1 tablet (10 mg total) by mouth 2 (two) times daily as needed for muscle spasms. 09/03/18   Agatha Alcon, CNM  ondansetron  (ZOFRAN -ODT) 8 MG disintegrating tablet Take 1 tablet (8 mg total) by mouth every 8 (eight) hours as needed. 12/03/23   Rasch, Bridgette Campus I, NP  Prenatal Multivit-Min-Fe-FA (PRENATAL VITAMINS) 0.8 MG tablet Take 1 tablet by mouth daily. Patient not taking: Reported on 09/03/2018 04/12/18   Sueellen Emery, MD  prochlorperazine  (COMPAZINE ) 10 MG tablet Take 1 tablet (10 mg total) by mouth every 6 (six) hours as needed for nausea or vomiting. 12/03/23   Rasch, Juliette Oh, NP  scopolamine  (TRANSDERM-SCOP) 1 MG/3DAYS Place 1 patch (1.5 mg total) onto the skin every 3 (three) days. 12/03/23   Rasch, Bridgette Campus I, NP  terconazole  (TERAZOL 7 )  0.4 % vaginal cream Place 1 applicator vaginally at bedtime. 09/03/18   Agatha Alcon, CNM    Allergies: Penicillins    Review of Systems  All other systems reviewed and are negative.   Updated Vital Signs BP 128/81   Pulse 72   Temp 98.1 F (36.7 C) (Oral)   Resp 16   Ht 1.778 m (5' 10)   Wt 73 kg   LMP 10/09/2023   SpO2 100%   BMI 23.09 kg/m   Physical Exam Vitals and nursing note reviewed.  Constitutional:      General: She is not in acute distress.    Appearance: She is well-developed.  HENT:     Head: Normocephalic and atraumatic.     Mouth/Throat:     Pharynx: No oropharyngeal exudate.   Eyes:     General: No scleral icterus.       Right eye: No discharge.        Left eye: No discharge.     Conjunctiva/sclera: Conjunctivae normal.     Pupils: Pupils are equal, round, and reactive to light.   Neck:     Thyroid: No thyromegaly.     Vascular: No JVD.   Cardiovascular:     Rate and Rhythm: Normal rate and regular rhythm.     Heart sounds: Normal heart sounds. No murmur heard.    No friction rub. No gallop.  Pulmonary:     Effort: Pulmonary effort is  normal. No respiratory distress.     Breath sounds: Normal breath sounds. No wheezing or rales.  Abdominal:     General: Bowel sounds are normal. There is no distension.     Palpations: Abdomen is soft. There is no mass.     Tenderness: There is no abdominal tenderness.   Musculoskeletal:        General: No tenderness. Normal range of motion.     Cervical back: Normal range of motion and neck supple.  Lymphadenopathy:     Cervical: No cervical adenopathy.   Skin:    General: Skin is warm and dry.     Findings: No erythema or rash.   Neurological:     Mental Status: She is alert.     Coordination: Coordination normal.   Psychiatric:        Behavior: Behavior normal.     (all labs ordered are listed, but only abnormal results are displayed) Labs Reviewed  WET PREP, GENITAL - Abnormal; Notable  for the following components:      Result Value   Clue Cells Wet Prep HPF POC PRESENT (*)    WBC, Wet Prep HPF POC RARE (*)    All other components within normal limits  URINALYSIS, ROUTINE W REFLEX MICROSCOPIC - Abnormal; Notable for the following components:   Color, Urine AMBER (*)    APPearance HAZY (*)    Specific Gravity, Urine 1.033 (*)    Glucose, UA 50 (*)    Protein, ur 100 (*)    Leukocytes,Ua TRACE (*)    Bacteria, UA RARE (*)    All other components within normal limits  URINE CULTURE  GC/CHLAMYDIA PROBE AMP ([facility]) NOT AT [facility]    EKG: None  Radiology: No results found.   Procedures   Medications Ordered in the ED - No data to display  Clinical Course as of 12/17/23 1327  Fri Dec 17, 2023  1240 Pt reevaluated  - improved and without complaint. [BM]    Clinical Course User Index [BM] Early Glisson, MD                                 Medical Decision Making Amount and/or Complexity of Data Reviewed Labs: ordered.  Risk Prescription drug management.   Pelvic exam: Normal-appearing external genitalia, cervix mildly friable, no discharge no drainage no foreign bodies, no foul smell, chaperone present for entire exam.  Vitals unremarkable, patient [redacted] weeks pregnant, confirmed IUP, minimal symptoms, urinalysis and STI testing pending.  Stable for discharge  No nausea // tolerating PO very well  BV positive, doubt other STIs based on exam, results will need to be followed up with patient if positive, home on metronidazole   I have discussed with the patient at the bedside the results, and the meaning of these results.  They have had opportunity to ask questions,  expressed their understanding to the need for follow-up with primary care physician     Final diagnoses:  BV (bacterial vaginosis)    ED Discharge Orders          Ordered    metroNIDAZOLE  (FLAGYL ) 500 MG tablet  2 times daily        12/17/23 1324               Early Glisson, MD 12/17/23 1327

## 2023-12-17 NOTE — ED Notes (Signed)
 ED Provider at bedside.

## 2023-12-18 LAB — URINE CULTURE: Culture: NO GROWTH

## 2023-12-20 LAB — GC/CHLAMYDIA PROBE AMP (~~LOC~~) NOT AT ARMC
Chlamydia: NEGATIVE
Comment: NEGATIVE
Comment: NORMAL
Neisseria Gonorrhea: NEGATIVE

## 2023-12-29 LAB — OB RESULTS CONSOLE RPR: RPR: NONREACTIVE

## 2023-12-29 LAB — HIV ANTIBODY (ROUTINE TESTING W REFLEX): HIV Screen 4th Generation wRfx: NONREACTIVE

## 2023-12-29 LAB — OB RESULTS CONSOLE HEPATITIS B SURFACE ANTIGEN: Hepatitis B Surface Ag: NEGATIVE

## 2023-12-29 LAB — OB RESULTS CONSOLE RUBELLA ANTIBODY, IGM: Rubella: IMMUNE

## 2024-01-17 DIAGNOSIS — B009 Herpesviral infection, unspecified: Secondary | ICD-10-CM | POA: Diagnosis present

## 2024-03-19 NOTE — H&P (Signed)
 ------------------------------------------------------------------------------- Summary: H&P -------------------------------------------------------------------------------  History and Physical Assessment/Plan:  Tina Beck is a 28 y.o. G3P0010 at [redacted]w[redacted]d Estimated Date of Delivery: 07/15/24 who is being evaluated secondary to noticing blood when she wiped after voiding spontaneously today.  Active Problems:   * No active hospital problems. *   -Evaluated in triage -Negative sterile speculum exam.  Cervix is closed thick and high.  No contractions on tocometer -Fetal heart rate tracing is reassuring for gestational age - UA with white blood cells present.  Recent BV infection for which she was treated last week.  Urine culture sent to be prudent - Rh status is positive - U tox is positive for cannabis.  Encouraged cessation - Patient is ruled out for vaginal bleeding, preterm labor, abruption.  She is stable for discharge.  Strict precautions given Dispo: Stable.  Plan to discharge to home.  Patient to follow-up with her OB/GYN this week  History of Present Illness:  Chief Complaint -vaginal bleeding  28 year old gravida 4 para 2 patient at 23 weeks and 1 day gestation presents with complaint of vaginal bleeding.  Patient reports that she went to urinate and when she wiped she noticed blood.  She then stuck her finger up in her vagina but did not notice any blood.  But wanted to be evaluated nonetheless.  Patient reports that she was treated for bacterial vaginosis with a vaginal cream on Wednesday.  She had intercourse on Friday without noting any contractions or vaginal bleeding.  She denies contractions, pelvic pain, leaking of fluid like her waters broke.  Positive fetal movement.  She denies any other signs or symptoms.  Medical records have been requested but not able to be viewed in our system.  Allergies  Penicillins  Medications    Current Medications[1]   OB  History  OB History  Gravida Para Term Preterm AB Living  3 0 0 0 1 0  SAB IAB Ectopic Molar Multiple Live Births  1 0 0 0 0 0    # Outcome Date GA Lbr Len/2nd Weight Sex Type Anes PTL Lv  3 Current           2 SAB 2017          1 Gravida                Past Medical History  Past Medical History[2]  Past Surgical History  Past Surgical History[3]  Past Social History:  Social History[4]   Family History  family history includes Asthma in her mother; Hypertension in her mother.   Review of Systems  A 12 system review of systems was negative except as noted in HPI   Vital Signs  Temp:  [36.7 C (98.1 F)] 36.7 C (98.1 F) Pulse:  [60-66] 64 Resp:  [16] 16 BP: (100-104)/(57-65) 104/65 MAP (mmHg):  [67-74] 74 SpO2:  [99 %-100 %] 100 % No intake/output data recorded.   Physical Exam   General Appearance No acute distress, well appearing and well nourished.  Eyes Pupils equal and round. Extraocular muscles intact and sclera clear.           Pulmonary Normal respiratory effort  Cardiovascular Pulse normal rate  Abdomen Normoactive bowel sounds, abdomen soft, gravid, no fundal tenderness, no rebound or guarding.     Genitourinary normal external genitalia, vulva, vagina, cervix, uterus and adnexa  SVE 0 , 20 ,  -3 speculum exam is negative for any blood in the vaginal vault, or pooling.  Normal physiologic  discharge is present.  Cervix is visually closed.  No evidence of blood from the cervical os.  Extremities No rash, lesions or petechiae. No bilateral cyanosis, clubbing.  Edema none  Skin Skin color, texture, turgor normal, no rash/lesions/breakdown  Neurologic Alert and oriented to person, place, and time. Cranial nerves II-XII grossly intact, normal gait. DTR 2+  Psychiatric Mood and affect within normal limits     Test Results  Lab results last 24 hours:  Recent Results (from the past 24 hours)  Drug Screen, Urine   Collection Time: 03/19/24   8:34 PM  Result Value Ref Range   Amphetamines Screen, Ur Negative Negative   Barbiturates Screen, Ur Negative Negative   Benzodiazepines Screen, Urine Negative Negative   Cannabinoids Screen, Ur Positive (A) Negative   Cocaine(Metab.)Screen, Urine Negative Negative   Methadone Screen, Urine Negative Negative   Opiates Screen, Ur Negative Negative   Oxycodone  Screen, Ur Negative Negative  Urinalysis with Microscopy   Collection Time: 03/19/24  8:34 PM  Result Value Ref Range   Color, UA Light Yellow    Clarity, UA Clear Clear   Specific Gravity, UA 1.024 1.010 - 1.025   pH, UA 5.5 5.0 - 8.0   Leukocyte Esterase, UA Large (A) Negative   Nitrite, UA Negative Negative   Protein, UA Negative Negative   Glucose, UA Negative Negative, Trace   Ketones, UA 40 mg/dL (A) Negative   Urobilinogen, UA <2.0 mg/dL <7.9 mg/dL   Bilirubin, UA Negative Negative   Blood, UA Negative Negative   RBC, UA 2 0 - 3 /HPF   WBC, UA 8 (H) 0 - 3 /HPF   WBC Clumps None Seen None Seen /HPF   Squam Epithel, UA 6 0 - 10 /HPF   Yeast, UA None Seen None Seen /HPF   Bacteria, UA None Seen None Seen /HPF   Hyphal Yeast None Seen None Seen /HPF   OB Transcribed Results: Mother's blood type B+  Antibody Screen No data was found  Rubella No data was found  Hepatitis B Status No data was found  RPR Screen No data was found  HIV No data was found  Group B Strep No data was found  Gonorrhea No data was found  Chlamydia No data was found  HSV No data was found             [1] No current facility-administered medications for this encounter.  [2] Past Medical History: Diagnosis Date   Miscarriage ([facility]) 2017  [3] Past Surgical History: Procedure Laterality Date   FINGER SURGERY  2016   rt small finger rod  [4] Social History Socioeconomic History   Marital status: Single    Spouse name: None   Number of children: None   Years of education: None   Highest education level: None   Tobacco Use   Smoking status: Former   Smokeless tobacco: Never  Substance and Sexual Activity   Alcohol use: No   Drug use: No   Sexual activity: Yes    Partners: Male   Social Drivers of Corporate Investment Banker Strain: Low Risk  (03/19/2024)   Overall Financial Resource Strain (CARDIA)    Difficulty of Paying Living Expenses: Not hard at all  Food Insecurity: No Food Insecurity (03/19/2024)   Hunger Vital Sign    Worried About Running Out of Food in the Last Year: Never true    Ran Out of Food in the Last Year: Never true  Transportation Needs:  No Transportation Needs (03/19/2024)   PRAPARE - Administrator, Civil Service (Medical): No    Lack of Transportation (Non-Medical): No  Housing: Low Risk  (03/19/2024)   Housing    Within the past 12 months, have you ever stayed: outside, in a car, in a tent, in an overnight shelter, or temporarily in someone else's home (i.e. couch-surfing)?: No    Are you worried about losing your housing?: No

## 2024-06-22 LAB — OB RESULTS CONSOLE GBS: GBS: NEGATIVE

## 2024-06-29 NOTE — ED Provider Notes (Signed)
 [facility] Emergency Department Note  Chief Complaint:  Headache (Pt ambulates to the ED today reporting headache for 3 days, denies any nausea. Pt reports she has been taking Tylenol , but no relief of pain. Pt reports she is [redacted] weeks pregnant. Pt reports she lost her mucous plug 1 week ago, pt denies any abdominal pain. Pt reports she has had discharge (pt unable to report color) for 3 weeks. )    History of Present Illness   Tina Beck is a 28 y.o. female  approximately [redacted] weeks gestation presenting today with frontal headache for 3 days.  Describes it as throbbing.  Was gradual in onset.  Endorses history of headaches in the past that felt similar, however she has not had a headache like this in about 2 years.  Taking Tylenol  without relief of sx. No fevers, neck stiffness, photophobia, phonophobia, URI symptoms, head trauma, numbness, tingling, weakness, nausea, vomiting, aphasia, dizziness, diplopia, abdominal pain, vision changes.  Independent Historian: Patient  Physical Exam   BP 115/74 (BP Location: Right arm, Patient Position: Lying)   Pulse 66   Temp 98.8 F (37.1 C) (Oral)   Resp 19   Ht 177.8 cm (5' 10)   Wt 87.5 kg (193 lb)   SpO2 100%   BMI 27.69 kg/m   Physical Exam Vitals and nursing note reviewed.  Constitutional:      General: She is not in acute distress.    Appearance: Normal appearance.  HENT:     Head: Normocephalic and atraumatic.  Eyes:     Conjunctiva/sclera: Conjunctivae normal.     Pupils: Pupils are equal, round, and reactive to light.  Pulmonary:     Effort: Pulmonary effort is normal.  Abdominal:     Tenderness: There is no guarding.     Comments: No tenderness palpation of the abdomen.  Skin:    General: Skin is warm and dry.  Neurological:     General: No focal deficit present.     Mental Status: She is alert.     Comments: Alert and oriented to person, place, time, and situation. Cranial nerves II through XII intact.  Strength is 5/5 to extremities with flexion and extension. Digital abduction and adduction bilaterally is intact. Grasp also 5/5 sensation to extremities intact.. Finger to nose is intact bilaterally, rapid alternating movements, pronator drift, Romberg and heel-to-shin are intact. Gait is steady.       Procedures   Procedures    ED Course & Medical Decision Making   This patient is a 38-week gestation female who is 28 years old presents with a headache most consistent with benign headache from either tension type headache vs migraine. No headache red flags. Neurologic exam without evidence of meningismus, AMS, focal neurologic findings so doubt meningitis, encephalitis, stroke. Presentation, history and physical not consistent with acute intracranial bleed to include SAH (lack of risk factors, headache history). No history of trauma so doubt ICH.   Patient with no signs of increased intracranial pressure or weight loss and history and physical suggest more benign headache so less likely mass effect in brain from tumor or abscess or idiopathic intracranial hypertension. Patient is normotensive with only trace proteinuria, no abdominal pain, no cranial nerve deficits, blurry vision, diplopia so doubt preeclampsia. Discussed with my attending Dr. Elsie Clinch who does not recommend additional blood work at this time.  Plan to administer migraine cocktail including Reglan, Benadryl , fluids, and magnesium . Patient endorses total resolution of headache.  Urinalysis with trace  leukocyte esterase and no blood or nitrites.  Reflex shows bacteria present, however it is contaminated with squamous epithelial cells and mucus.  Will send culture to the lab and defer antibiotics at this time.  Fetal heart tones were obtained and are 132.  Patient denies abdominal pain, vaginal bleeding, or decreased fetal movement.  Believe emergent OB/GYN evaluation indicated at this time.  Will advise follow-up with OB/GYN in  the outpatient setting and return precautions were advised.    Reassessment:     Independent Interpretation of Studies: Labs; reviewed  External Records Reviewed: None  Discussion with other Services: None  Escalation of Care, Including Admision/Obs considered but not performed: N/A          Clinical Impression & Disposition   1. Nonintractable headache, unspecified chronicity pattern, unspecified headache type         Disposition:  Discharge     ED Prescriptions   None        Past Contributory History  Reviewed from chart  Allergies: Penicillin   Personal History: Medical History[1]  Surgical History[2] Family History[3]  Social History[4]  Home Medications: No current outpatient medications   Results  Labs: Labs Reviewed  URINALYSIS WITH REFLEX TO MICROSCOPIC - Abnormal      Result Value   Color, Urine Yellow     Clarity, Urine Clear     Specific Gravity, Urine 1.025     pH, Urine 6.5     Protein, Urine Trace (*)    Glucose, Urine Negative     Ketones, Urine 15 (*)    Bilirubin, Urine Negative     Blood, Urine Negative     Nitrite, Urine Negative     Leukocyte Esterase, Urine Trace (*)    Urobilinogen, Urine 2.0 (*)   URINALYSIS, MICROSCOPIC ONLY - Abnormal   WBC, Urine 6-10 (*)    RBC, Urine 0-2     Squamous Epithelial Cells, Urine 6-10 (*)    Mucus, Urine Moderate (*)    Bacteria, Urine Moderate (*)   URINE CULTURE    Imaging: No orders to display     EKG: No results found for this or any previous visit (from the past 4464 hours).   ED Meds Given: Medications  acetaminophen  (TYLENOL ) 160 mg/5 mL solution 650 mg (650 mg oral Not Given 06/29/24 1900)  metoclopramide (REGLAN) injection 10 mg (10 mg intravenous Given 06/29/24 1834)  diphenhydrAMINE  (BENADRYL ) injection 12.5 mg (12.5 mg intravenous Given 06/29/24 1833)  sodium chloride  (bolus) 0.9 % bolus 1,000 mL (0 mL intravenous Stopped 06/29/24 1900)  magnesium  sulfate  2g in sterile water  50 mL IVPB (0 g intravenous Stopped 06/29/24 1917)     Clinical Decision Support:     CHA2DS2-VASc Score: N/A           Glasgow Coma Scale Score: 15                                           Neville Sanders PA-C [facility] Department of Emergency Medicine   Time Seen     Event Date/Time   First Provider Evaluation 06/29/24 1753              [1] No past medical history on file. [2] No past surgical history on file. [3] No family history on file. [4]

## 2024-07-06 NOTE — L&D Delivery Note (Signed)
 OB/GYN Faculty Practice Delivery Note  Tina Beck is a 29 y.o. [id number] s/p SVD. She was admitted for IOL for decreased fetal movement and oligohydramnious at at [redacted]w[redacted]d.   ROM: 07/08/2024 at 1425 at Delivery, with clear fluid  GBS Status: Negative Maximum Maternal Temperature: 98.4  Labor Progress: Tina Beck is a 29 y.o. female presenting for IOL for oligohydramnios and decreased fetal movement at 39 weeks. She recently moved to [facility] from [facility], where she received [facility]. but Unable to see records in epic, but briefly reviewed records on her cell phone with her.       Patient Active Problem List    Diagnosis Date Noted   Pregnancy 07/08/2024   Herpesviral infection, unspecified 01/17/2024   Chlamydia 03/22/2017   Missed abortion 10/14/2015    Delivery Date/Time: 07/08/2024 at 1426 Delivery: SNM and CNM called to room and patient was involuntary pushing with a bulging bag visualiuzed. Head delivered LOA. No nuchal cord present. Shoulder and body delivered in usual fashion with brief delay (<1 minute) related to patient difficult to maintain control; nursing staff assisted to facilitate leg positioning for delivery. Infant with spontaneous cry, placed on mother's abdomen, dried and stimulated. Cord clamped x 2 after 1-minute delay, and cut by patient's brother. Cord blood drawn. Placenta delivered spontaneously, intact, with 3-vessel cord. Fundus firm with massage and Pitocin . Labia, perineum, vagina, and cervix inspected, no laceration found and repaired.  Placenta: Intact, delivered spontaneously at 1441 Complications: None Lacerations: None EBL: 252 Analgesia: Fentanyl    Postpartum Planning [email or web address].GANT ] transfer orders to [facility] [email or web address].GANT ] discharge summary started & shared [X]  message to sent to schedule follow-up  [email or web address] ] lists updated  Infant: Baby Boy  APGARs 9/9  3040g  Mattia Liford, SNM 07/08/2024, 2:57 PM

## 2024-07-07 ENCOUNTER — Encounter (HOSPITAL_COMMUNITY): Payer: Self-pay | Admitting: Obstetrics and Gynecology

## 2024-07-07 ENCOUNTER — Encounter (HOSPITAL_COMMUNITY): Payer: Self-pay | Admitting: Emergency Medicine

## 2024-07-07 ENCOUNTER — Inpatient Hospital Stay (HOSPITAL_COMMUNITY)
Admission: EM | Admit: 2024-07-07 | Discharge: 2024-07-10 | DRG: 806 | Disposition: A | Discharge status: Home / Self Care | Attending: Obstetrics and Gynecology | Admitting: Obstetrics and Gynecology

## 2024-07-07 ENCOUNTER — Inpatient Hospital Stay (HOSPITAL_COMMUNITY)
Admission: AD | Admit: 2024-07-07 | Discharge: 2024-07-07 | Disposition: A | Discharge status: Home / Self Care | Source: Home / Self Care | Attending: Obstetrics and Gynecology | Admitting: Obstetrics and Gynecology

## 2024-07-07 ENCOUNTER — Other Ambulatory Visit: Payer: Self-pay

## 2024-07-07 DIAGNOSIS — O368130 Decreased fetal movements, third trimester, not applicable or unspecified: Secondary | ICD-10-CM

## 2024-07-07 DIAGNOSIS — Z880 Allergy status to penicillin: Secondary | ICD-10-CM

## 2024-07-07 DIAGNOSIS — Z3A38 38 weeks gestation of pregnancy: Secondary | ICD-10-CM

## 2024-07-07 DIAGNOSIS — Z743 Need for continuous supervision: Secondary | ICD-10-CM | POA: Diagnosis not present

## 2024-07-07 DIAGNOSIS — Z3A39 39 weeks gestation of pregnancy: Secondary | ICD-10-CM

## 2024-07-07 DIAGNOSIS — O4103X0 Oligohydramnios, third trimester, not applicable or unspecified: Principal | ICD-10-CM | POA: Diagnosis present

## 2024-07-07 DIAGNOSIS — B009 Herpesviral infection, unspecified: Secondary | ICD-10-CM | POA: Diagnosis present

## 2024-07-07 DIAGNOSIS — O4700 False labor before 37 completed weeks of gestation, unspecified trimester: Principal | ICD-10-CM

## 2024-07-07 DIAGNOSIS — Z3490 Encounter for supervision of normal pregnancy, unspecified, unspecified trimester: Secondary | ICD-10-CM | POA: Diagnosis present

## 2024-07-07 DIAGNOSIS — O9832 Other infections with a predominantly sexual mode of transmission complicating childbirth: Secondary | ICD-10-CM | POA: Diagnosis present

## 2024-07-07 DIAGNOSIS — O4100X0 Oligohydramnios, unspecified trimester, not applicable or unspecified: Secondary | ICD-10-CM | POA: Diagnosis present

## 2024-07-07 DIAGNOSIS — Z3689 Encounter for other specified antenatal screening: Secondary | ICD-10-CM | POA: Insufficient documentation

## 2024-07-07 DIAGNOSIS — A6000 Herpesviral infection of urogenital system, unspecified: Secondary | ICD-10-CM | POA: Diagnosis present

## 2024-07-07 DIAGNOSIS — Z87891 Personal history of nicotine dependence: Secondary | ICD-10-CM

## 2024-07-07 DIAGNOSIS — R1084 Generalized abdominal pain: Secondary | ICD-10-CM | POA: Diagnosis not present

## 2024-07-07 HISTORY — DX: Chlamydial infection, unspecified: A74.9

## 2024-07-07 HISTORY — DX: Anxiety disorder, unspecified: F41.9

## 2024-07-07 HISTORY — DX: Depression, unspecified: F32.A

## 2024-07-07 HISTORY — DX: Herpesviral infection, unspecified: B00.9

## 2024-07-07 NOTE — ED Provider Notes (Signed)
 " [place] EMERGENCY DEPARTMENT AT [facility] Provider Note   CSN: [id number] Arrival date & time: 07/07/24  2152     Patient presents with: Abdominal Pain and Contractions   Tina Beck is a 29 y.o. female.   Patient is 38 weeks and 6 days a gravida 4 para 2.  Patient states she started having abdominal cramping similar to contractions.  She came here because she wanted to make sure these were not real contractions.  The history is provided by the patient and medical records.  Abdominal Pain Pain location:  Generalized Pain quality: aching   Pain radiates to:  Does not radiate Pain severity:  Mild Onset quality:  Sudden Chronicity:  New Associated symptoms: no chest pain, no cough, no diarrhea, no fatigue and no hematuria        Prior to Admission medications  Medication Sig Start Date End Date Taking? Authorizing Provider  cyclobenzaprine  (FLEXERIL ) 10 MG tablet Take 1 tablet (10 mg total) by mouth 2 (two) times daily as needed for muscle spasms. Patient not taking: Reported on 07/07/2024 09/03/18   Marylen Aleck HERO, CNM  metroNIDAZOLE  (FLAGYL ) 500 MG tablet Take 1 tablet (500 mg total) by mouth 2 (two) times daily. Patient not taking: Reported on 07/07/2024 12/17/23   Cleotilde Rogue, MD  ondansetron  (ZOFRAN -ODT) 8 MG disintegrating tablet Take 1 tablet (8 mg total) by mouth every 8 (eight) hours as needed. Patient not taking: Reported on 07/07/2024 12/03/23   Rasch, Delon I, NP  Prenatal Multivit-Min-Fe-FA (PRENATAL VITAMINS) 0.8 MG tablet Take 1 tablet by mouth daily. 04/12/18   Dean Clarity, MD  prochlorperazine  (COMPAZINE ) 10 MG tablet Take 1 tablet (10 mg total) by mouth every 6 (six) hours as needed for nausea or vomiting. Patient not taking: Reported on 07/07/2024 12/03/23   Rasch, Delon I, NP  scopolamine  (TRANSDERM-SCOP) 1 MG/3DAYS Place 1 patch (1.5 mg total) onto the skin every 3 (three) days. Patient not taking: Reported on 07/07/2024 12/03/23   Rasch,  Delon I, NP  terconazole  (TERAZOL 7 ) 0.4 % vaginal cream Place 1 applicator vaginally at bedtime. Patient not taking: Reported on 07/07/2024 09/03/18   Marylen Aleck HERO, CNM    Allergies: Penicillins    Review of Systems  Constitutional:  Negative for appetite change and fatigue.  HENT:  Negative for congestion, ear discharge and sinus pressure.   Eyes:  Negative for discharge.  Respiratory:  Negative for cough.   Cardiovascular:  Negative for chest pain.  Gastrointestinal:  Positive for abdominal pain. Negative for diarrhea.  Genitourinary:  Negative for frequency and hematuria.  Musculoskeletal:  Negative for back pain.  Skin:  Negative for rash.  Neurological:  Negative for seizures and headaches.  Psychiatric/Behavioral:  Negative for hallucinations.     Updated Vital Signs BP (!) 94/59 (BP Location: Left Arm)   Pulse 83   Temp 98 F (36.7 C) (Oral)   Resp 18   Ht 5' 10 (1.778 m)   Wt 87 kg   LMP 10/09/2023   SpO2 98%   BMI 27.52 kg/m   Physical Exam Vitals and nursing note reviewed.  Constitutional:      Appearance: She is well-developed.  HENT:     Head: Normocephalic.     Nose: Nose normal.  Eyes:     General: No scleral icterus.    Conjunctiva/sclera: Conjunctivae normal.  Neck:     Thyroid: No thyromegaly.  Cardiovascular:     Rate and Rhythm: Normal rate and regular  rhythm.     Heart sounds: No murmur heard.    No friction rub. No gallop.  Pulmonary:     Breath sounds: No stridor. No wheezing or rales.  Chest:     Chest wall: No tenderness.  Abdominal:     Tenderness: There is no abdominal tenderness. There is no rebound.     Comments: Abdomen distended and consistent with full-term pregnancy  Genitourinary:    Comments: Vaginal exam shows vertex presentation with 50% effaced and 3 to 4 cm dilated Musculoskeletal:        General: Normal range of motion.     Cervical back: Neck supple.  Lymphadenopathy:     Cervical: No cervical adenopathy.   Skin:    Findings: No erythema or rash.  Neurological:     Mental Status: She is alert and oriented to person, place, and time.     Motor: No abnormal muscle tone.     Coordination: Coordination normal.  Psychiatric:        Behavior: Behavior normal.     (all labs ordered are listed, but only abnormal results are displayed) Labs Reviewed - No data to display  EKG: None  Radiology: No results found.   Procedures   Medications Ordered in the ED - No data to display                                  Medical Decision Making  Full-term pregnancy possibly in labor.  Patient will be transferred to [facility].  Dr. Bascom Birk accepting     Final diagnoses:  None    ED Discharge Orders     None          Suzette Pac, MD 07/09/24 1547  "

## 2024-07-07 NOTE — MAU Provider Note (Signed)
 Chief Complaint:  Decreased Fetal Movement   HPI   Tina Beck is a 29 y.o. [id number] at [redacted]w[redacted]d who presents to maternity admissions reporting that she is 38 weeks 6 days pregnant reporting decreased fetal movement.  Patient states she receives her care from [facility] [place] .  She states she was away over the [place] holiday and missed her last appointment on July 05, 2024 and therefore since she was not feeling the baby move decided to come to the hospital to have baby checked.  She denies any vaginal bleeding, leaking of fluid, and offers no other OB complaints at this time.   Pregnancy Course: [place]  Past Medical History:  Diagnosis Date   Anxiety    Chlamydia    Depression    Herpes simplex    OB History  Gravida Para Term Preterm AB Living  4 2 2  1 2   SAB IAB Ectopic Multiple Live Births  1    2    # Outcome Date GA Lbr Len/2nd Weight Sex Type Anes PTL Lv  4 Current           3 Term 11/18/18 [redacted]w[redacted]d  2722 g M Vag-Spont   LIV  2 Term 11/07/17 [redacted]w[redacted]d  3175 g M Vag-Spont   LIV  1 SAB 10/2015           Past Surgical History:  Procedure Laterality Date   MOUTH SURGERY     OPEN REDUCTION INTERNAL FIXATION (ORIF) METACARPAL Right 03/23/2016   Procedure: OPEN REDUCTION INTERNAL FIXATION OF RIGHT 5TH METACARPAL;  Surgeon: Donnice Robinsons, MD;  Location: [facility];  Service: Orthopedics;  Laterality: Right;   History reviewed. No pertinent family history. Social History[1] Allergies[2] No medications prior to admission.    I have reviewed patient's Past Medical Hx, Surgical Hx, Family Hx, Social Hx, medications and allergies.   ROS  Pertinent items noted in HPI and remainder of comprehensive ROS otherwise negative.   PHYSICAL EXAM  Patient Vitals for the past 24 hrs:  BP Temp Temp src Pulse Resp SpO2 Height Weight  07/07/24 1040 108/77 -- -- 75 -- -- -- --  07/07/24 0930 -- -- -- -- -- 98 % -- --  07/07/24 0929 114/77 -- -- 90 -- -- -- --   07/07/24 0925 -- -- -- -- -- 98 % -- --  07/07/24 0922 114/81 98.6 F (37 C) Oral 87 16 99 % 5' 10 (1.778 m) 86.9 kg    Constitutional: Well-developed, well-nourished female in no acute distress.  Cardiovascular: normal rate & rhythm, warm and well-perfused Respiratory: normal effort, no problems with respiration noted GI: Abd soft, non-tender, gravid MS: Extremities nontender, no edema, normal ROM Neurologic: Alert and oriented x 4.      Fetal Tracing:  NST reactive Baseline: 130 Variability: Moderate  Accelerations: present Decelerations: absent Toco: quite     MDM & MAU COURSE  MDM:  Moderate  Available prenatal records reviewed NST for gestational age and fetal reassurance (NST category 1 reactive and patient verbalized feeling fetal movements as well as marking them on the clicker) Education provided to the patient on fetal kick counts and she has follow-up with her primary OB in [place] .  Stable for discharge at this time   MAU Course: Orders Placed This Encounter  Procedures   OB RESULT CONSOLE Group B Strep   Discharge patient Discharge disposition: 01-Home or Self Care; Discharge patient date: 07/07/2024    I have reviewed the  patient chart and performed the physical exam . I have ordered &  interpreted the NST     ASSESSMENT   1. Decreased fetal movements in third trimester, single or unspecified fetus   2. [redacted] weeks gestation of pregnancy   3. NST (non-stress test) reactive on fetal surveillance     PLAN  Discharge home in stable condition with return precautions.   Fetal kick Counts reviewed and patient verbalized understanding  See AVS for full description of information given to the patient including both verbal and written. Patient verbalized understanding and agrees with the plan as described above.   Follow up with Primary OB as scheduled    Allergies as of 07/07/2024       Reactions   Penicillins Hives, Itching   Has patient had  a PCN reaction causing immediate rash, facial/tongue/throat swelling, SOB or lightheadedness with hypotension: No Has patient had a PCN reaction causing severe rash involving mucus membranes or skin necrosis: No Has patient had a PCN reaction that required hospitalization: No Has patient had a PCN reaction occurring within the last 10 years: Yes If all of the above answers are NO, then may proceed with Cephalosporin use.        Medication List     TAKE these medications    cyclobenzaprine  10 MG tablet Commonly known as: FLEXERIL  Take 1 tablet (10 mg total) by mouth 2 (two) times daily as needed for muscle spasms.   metroNIDAZOLE  500 MG tablet Commonly known as: FLAGYL  Take 1 tablet (500 mg total) by mouth 2 (two) times daily.   ondansetron  8 MG disintegrating tablet Commonly known as: ZOFRAN -ODT Take 1 tablet (8 mg total) by mouth every 8 (eight) hours as needed.   Prenatal Vitamins 0.8 MG tablet Take 1 tablet by mouth daily.   prochlorperazine  10 MG tablet Commonly known as: COMPAZINE  Take 1 tablet (10 mg total) by mouth every 6 (six) hours as needed for nausea or vomiting.   scopolamine  1 MG/3DAYS Commonly known as: TRANSDERM-SCOP Place 1 patch (1.5 mg total) onto the skin every 3 (three) days.   terconazole  0.4 % vaginal cream Commonly known as: Terazol 7  Place 1 applicator vaginally at bedtime.        Olam Dalton, MSN, WHNP-BC [facility], [facility] [employer]       [1]  Social History Tobacco Use   Smoking status: Former   Smokeless tobacco: Never  Vaping Use   Vaping status: Never Used  Substance Use Topics   Alcohol use: Not Currently   Drug use: Not Currently    Types: Marijuana  [2]  Allergies Allergen Reactions   Penicillins Hives and Itching    Has patient had a PCN reaction causing immediate rash, facial/tongue/throat swelling, SOB or lightheadedness with hypotension: No Has patient had a PCN reaction causing  severe rash involving mucus membranes or skin necrosis: No Has patient had a PCN reaction that required hospitalization: No Has patient had a PCN reaction occurring within the last 10 years: Yes If all of the above answers are NO, then may proceed with Cephalosporin use.

## 2024-07-07 NOTE — ED Notes (Signed)
 [facility] arrived

## 2024-07-07 NOTE — Progress Notes (Signed)
 2211: OBRRN called for pt at [facility] at 38.[redacted] wks GA, G4P2. Pt came in with c/o ctx q56min- can't tell if they're braxton hicks or not. Seen in MAU earlier today. Dr. Suzette performed SVE and states pt is 3-4cm/ 50%/ and vtx. States he will be transferring to MAU.   2223: Ginger, RN called to adjust TOCO due to no ctx tracing. Ginger states Dr. Fredirick is accepting pt to MAU.   2227: MAU Charge, Adrien called and notified of incoming transfer.   2249: [facility] RN, Kayla called about FHR not tracing, will adjust.   2308: Monitors discontinued for transfer to MAU.

## 2024-07-07 NOTE — ED Notes (Signed)
 [facility] called for Hot call to MAU

## 2024-07-07 NOTE — Discharge Instructions (Addendum)
 Tina Beck

## 2024-07-07 NOTE — MAU Note (Addendum)
 Tina Beck is a 29 y.o. at [redacted]w[redacted]d here in MAU reporting: here from [facility] c/o mild ctx that started about 2 hours ago and have been every 7 minutes. Denies VB or LOF. Was seen here in MAU this morning for DFM - d/c home, but reports she has not felt FM all day - last movement was yesterday. Receives [facility] in [place].   Pain score: 9 Vitals:   07/07/24 2204 07/07/24 2357  BP:  124/74  Pulse: 83 66  Resp:  16  Temp:  98.2 F (36.8 C)  SpO2: 98% 99%     FHT: 154  Lab orders placed from triage: none.

## 2024-07-07 NOTE — ED Notes (Signed)
 Charmaine, MAINE Rapid Response Nurse notified that pt had been placed on monitor. Dr Suzette @ bedside to   assess pt. Pt determined to be 3-4 cm dilated, 50% effaced, and Vertexed per Dr Suzette. Above communicated to OB rapid response nurse.

## 2024-07-07 NOTE — ED Triage Notes (Signed)
 Pt with c/o abdominal pain x 1 hr. Pt due 07/15/24. Pt states it feels like contractions but that she can't tell if they are Tina Beck or not.

## 2024-07-07 NOTE — MAU Note (Signed)
 Tina Beck is a 29 y.o. at [redacted]w[redacted]d here in MAU reporting: decreased fetal movement. Patient states the last time she felt her baby move was yesterday morning. Denies vaginal bleeding and leaking of fluid.  LMP: 10/09/23 Onset of complaint: 07/06/24 Pain score: 0/10 Vitals:   07/07/24 0922  BP: 114/81  Pulse: 87  Resp: 16  Temp: 98.6 F (37 C)  SpO2: 99%     FHT: 143bpm

## 2024-07-08 ENCOUNTER — Inpatient Hospital Stay (HOSPITAL_COMMUNITY)

## 2024-07-08 ENCOUNTER — Encounter (HOSPITAL_COMMUNITY): Payer: Self-pay | Admitting: Family Medicine

## 2024-07-08 DIAGNOSIS — O4103X0 Oligohydramnios, third trimester, not applicable or unspecified: Secondary | ICD-10-CM | POA: Diagnosis not present

## 2024-07-08 DIAGNOSIS — Z87891 Personal history of nicotine dependence: Secondary | ICD-10-CM | POA: Diagnosis not present

## 2024-07-08 DIAGNOSIS — A6000 Herpesviral infection of urogenital system, unspecified: Secondary | ICD-10-CM | POA: Diagnosis present

## 2024-07-08 DIAGNOSIS — O368130 Decreased fetal movements, third trimester, not applicable or unspecified: Secondary | ICD-10-CM | POA: Diagnosis not present

## 2024-07-08 DIAGNOSIS — Z3A39 39 weeks gestation of pregnancy: Secondary | ICD-10-CM

## 2024-07-08 DIAGNOSIS — O9832 Other infections with a predominantly sexual mode of transmission complicating childbirth: Secondary | ICD-10-CM | POA: Diagnosis present

## 2024-07-08 DIAGNOSIS — Z880 Allergy status to penicillin: Secondary | ICD-10-CM | POA: Diagnosis not present

## 2024-07-08 DIAGNOSIS — O4100X0 Oligohydramnios, unspecified trimester, not applicable or unspecified: Secondary | ICD-10-CM | POA: Diagnosis present

## 2024-07-08 DIAGNOSIS — Z3490 Encounter for supervision of normal pregnancy, unspecified, unspecified trimester: Principal | ICD-10-CM

## 2024-07-08 LAB — CBC
HCT: 37.4 % (ref 36.0–46.0)
Hemoglobin: 12.2 g/dL (ref 12.0–15.0)
MCH: 30.2 pg (ref 26.0–34.0)
MCHC: 32.6 g/dL (ref 30.0–36.0)
MCV: 92.6 fL (ref 80.0–100.0)
Platelets: 275 K/uL (ref 150–400)
RBC: 4.04 MIL/uL (ref 3.87–5.11)
RDW: 12.8 % (ref 11.5–15.5)
WBC: 9.6 K/uL (ref 4.0–10.5)
nRBC: 0 % (ref 0.0–0.2)

## 2024-07-08 LAB — TYPE AND SCREEN
ABO/RH(D): B POS
Antibody Screen: NEGATIVE

## 2024-07-08 LAB — SYPHILIS: RPR W/REFLEX TO RPR TITER AND TREPONEMAL ANTIBODIES, TRADITIONAL SCREENING AND DIAGNOSIS ALGORITHM: RPR Ser Ql: NONREACTIVE

## 2024-07-08 MED ORDER — DIBUCAINE (PERIANAL) 1 % EX OINT: 1.0000 | TOPICAL_OINTMENT | CUTANEOUS | Status: DC | PRN

## 2024-07-08 MED ORDER — BENZOCAINE-MENTHOL 20-0.5 % EX AERO
1.0000 | INHALATION_SPRAY | CUTANEOUS | Status: DC | PRN
  Administered 2024-07-10: 1 via TOPICAL
  Filled 2024-07-08: qty 56

## 2024-07-08 MED ORDER — SENNOSIDES-DOCUSATE SODIUM 8.6-50 MG PO TABS
2.0000 | ORAL_TABLET | Freq: Every day | ORAL | Status: DC
  Administered 2024-07-09 – 2024-07-10 (×2): 2 via ORAL
  Filled 2024-07-08 (×2): qty 2

## 2024-07-08 MED ORDER — DIPHENHYDRAMINE HCL 50 MG/ML IJ SOLN: 12.5000 mg | INTRAMUSCULAR | Status: DC | PRN

## 2024-07-08 MED ORDER — WITCH HAZEL-GLYCERIN EX PADS: 1.0000 | MEDICATED_PAD | CUTANEOUS | Status: DC | PRN

## 2024-07-08 MED ORDER — EPHEDRINE 5 MG/ML INJ: 10.0000 mg | INTRAVENOUS | Status: DC | PRN

## 2024-07-08 MED ORDER — OXYCODONE-ACETAMINOPHEN 5-325 MG PO TABS: 1.0000 | ORAL_TABLET | ORAL | Status: DC | PRN

## 2024-07-08 MED ORDER — IBUPROFEN 600 MG PO TABS
600.0000 mg | ORAL_TABLET | Freq: Four times a day (QID) | ORAL | Status: DC
  Administered 2024-07-08 – 2024-07-10 (×5): 600 mg via ORAL
  Filled 2024-07-08 (×6): qty 1

## 2024-07-08 MED ORDER — SIMETHICONE 80 MG PO CHEW: 80.0000 mg | CHEWABLE_TABLET | ORAL | Status: DC | PRN

## 2024-07-08 MED ORDER — LACTATED RINGERS IV SOLN: 500.0000 mL | Freq: Once | INTRAVENOUS | Status: DC

## 2024-07-08 MED ORDER — OXYTOCIN BOLUS FROM INFUSION
333.0000 mL | Freq: Once | INTRAVENOUS | Status: AC
  Administered 2024-07-08: 333 mL via INTRAVENOUS

## 2024-07-08 MED ORDER — FENTANYL-BUPIVACAINE-NACL 0.5-0.125-0.9 MG/250ML-% EP SOLN
12.0000 mL/h | EPIDURAL | Status: DC | PRN
  Filled 2024-07-08: qty 250

## 2024-07-08 MED ORDER — OXYTOCIN-SODIUM CHLORIDE 30-0.9 UT/500ML-% IV SOLN
1.0000 m[IU]/min | INTRAVENOUS | Status: DC
  Administered 2024-07-08: 2 m[IU]/min via INTRAVENOUS
  Filled 2024-07-08: qty 500

## 2024-07-08 MED ORDER — PHENYLEPHRINE 80 MCG/ML (10ML) SYRINGE FOR IV PUSH (FOR BLOOD PRESSURE SUPPORT): 80.0000 ug | PREFILLED_SYRINGE | INTRAVENOUS | Status: DC | PRN

## 2024-07-08 MED ORDER — LACTATED RINGERS IV SOLN: INTRAVENOUS | Status: DC

## 2024-07-08 MED ORDER — ACETAMINOPHEN 325 MG PO TABS: 650.0000 mg | ORAL_TABLET | ORAL | Status: DC | PRN

## 2024-07-08 MED ORDER — OXYTOCIN-SODIUM CHLORIDE 30-0.9 UT/500ML-% IV SOLN
2.5000 [IU]/h | INTRAVENOUS | Status: DC
  Administered 2024-07-08: 2.5 [IU]/h via INTRAVENOUS

## 2024-07-08 MED ORDER — ONDANSETRON HCL 4 MG/2ML IJ SOLN: 4.0000 mg | Freq: Four times a day (QID) | INTRAMUSCULAR | Status: DC | PRN

## 2024-07-08 MED ORDER — LIDOCAINE HCL (PF) 1 % IJ SOLN: 30.0000 mL | INTRAMUSCULAR | Status: DC | PRN

## 2024-07-08 MED ORDER — COCONUT OIL OIL: 1.0000 | TOPICAL_OIL | Status: DC | PRN

## 2024-07-08 MED ORDER — FENTANYL CITRATE (PF) 100 MCG/2ML IJ SOLN
INTRAMUSCULAR | Status: AC
  Filled 2024-07-08: qty 2

## 2024-07-08 MED ORDER — OXYCODONE-ACETAMINOPHEN 5-325 MG PO TABS: 2.0000 | ORAL_TABLET | ORAL | Status: DC | PRN

## 2024-07-08 MED ORDER — ONDANSETRON HCL 4 MG/2ML IJ SOLN: 4.0000 mg | INTRAMUSCULAR | Status: DC | PRN

## 2024-07-08 MED ORDER — DIPHENHYDRAMINE HCL 25 MG PO CAPS: 25.0000 mg | ORAL_CAPSULE | Freq: Four times a day (QID) | ORAL | Status: DC | PRN

## 2024-07-08 MED ORDER — ZOLPIDEM TARTRATE 5 MG PO TABS: 5.0000 mg | ORAL_TABLET | Freq: Every evening | ORAL | Status: DC | PRN

## 2024-07-08 MED ORDER — FENTANYL CITRATE (PF) 100 MCG/2ML IJ SOLN
100.0000 ug | INTRAMUSCULAR | Status: DC | PRN
  Administered 2024-07-08: 50 ug via INTRAVENOUS
  Administered 2024-07-08: 100 ug via INTRAVENOUS
  Filled 2024-07-08: qty 2

## 2024-07-08 MED ORDER — TETANUS-DIPHTH-ACELL PERTUSSIS 5-2-15.5 LF-MCG/0.5 IM SUSP: 0.5000 mL | Freq: Once | INTRAMUSCULAR | Status: DC

## 2024-07-08 MED ORDER — SOD CITRATE-CITRIC ACID 500-334 MG/5ML PO SOLN: 30.0000 mL | ORAL | Status: DC | PRN

## 2024-07-08 MED ORDER — OXYCODONE HCL 5 MG PO TABS: 5.0000 mg | ORAL_TABLET | ORAL | Status: DC | PRN

## 2024-07-08 MED ORDER — TERBUTALINE SULFATE 1 MG/ML IJ SOLN: 0.2500 mg | Freq: Once | INTRAMUSCULAR | Status: DC | PRN

## 2024-07-08 MED ORDER — ONDANSETRON HCL 4 MG PO TABS: 4.0000 mg | ORAL_TABLET | ORAL | Status: DC | PRN

## 2024-07-08 MED ORDER — LACTATED RINGERS IV SOLN: 500.0000 mL | INTRAVENOUS | Status: DC | PRN

## 2024-07-08 MED ORDER — PRENATAL MULTIVITAMIN CH
1.0000 | ORAL_TABLET | Freq: Every day | ORAL | Status: DC
  Administered 2024-07-09 – 2024-07-10 (×2): 1 via ORAL
  Filled 2024-07-08 (×2): qty 1

## 2024-07-08 NOTE — Discharge Summary (Signed)
 "    Postpartum Discharge Summary  Date of Service updated-07/10/2024     Patient Name: Tina Beck DOB: February 26, 1996 MRN: [id number]  Date of admission: 07/07/2024 Delivery date:07/08/2024 Delivering provider: LORELI IHA D Date of discharge: 07/10/2024  Admitting diagnosis: Pregnancy [Z34.90] Intrauterine pregnancy: [redacted]w[redacted]d     Secondary diagnosis:  Principal Problem:   Pregnancy Active Problems:   Herpesviral infection, unspecified   Oligohydramnios  Additional problems: none    Discharge diagnosis: Term Pregnancy Delivered                                              Post partum procedures:none Augmentation: Pitocin  Complications: None  Hospital course: Induction of Labor With Vaginal Delivery   29 y.o. yo [phone] at [redacted]w[redacted]d was admitted to the hospital 07/07/2024 for induction of labor.  Indication for induction: DFM & oligohydramnios.  Patient had an uncomplicated labor course. Membrane Rupture Time/Date:  ,   Delivery Method:Vaginal, Spontaneous Operative Delivery:N/A Episiotomy: None Lacerations:  None Details of delivery can be found in separate delivery note.  Patient had a postpartum course that was uncomplicated. Patient is discharged home 07/10/2024.  Newborn Data: Birth date:07/08/2024 Birth time:2:26 PM Gender:Female Living status:Living Apgars:9 ,9  Weight:3040 g (6lb 11.2oz)  Magnesium  Sulfate received: No BMZ received: No Rhophylac:N/A MMR:N/A T-DaP:offered postpartum Flu: No RSV Vaccine received: No Transfusion:No  Immunizations received: There is no immunization history for the selected administration types on file for this patient.  Physical exam  Vitals:   07/09/24 0559 07/09/24 1335 07/09/24 2100 07/10/24 0502  BP: 114/79 111/78 113/71 109/74  Pulse: 74 75 75 (!) 58  Resp: 18 18 18 17   Temp:  98.7 F (37.1 C)  98.3 F (36.8 C)  TempSrc: Oral Oral  Oral  SpO2: 100% 100% 100% 98%  Weight:      Height:       General: alert, cooperative,  and no distress CV: RRR Lungs: CTAB Lochia: appropriate Uterine Fundus: firm Incision: N/A DVT Evaluation: No evidence of DVT seen on physical exam. Labs: Lab Results  Component Value Date   WBC 9.9 07/09/2024   HGB 10.2 (L) 07/09/2024   HCT 30.1 (L) 07/09/2024   MCV 90.4 07/09/2024   PLT 229 07/09/2024      Latest Ref Rng & Units 12/03/2023    3:18 PM  CMP  Glucose 70 - 99 mg/dL 897   BUN 6 - 20 mg/dL 10   Creatinine 9.55 - 1.00 mg/dL 9.26   Sodium 864 - 854 mmol/L 136   Potassium 3.5 - 5.1 mmol/L 3.7   Chloride 98 - 111 mmol/L 101   CO2 22 - 32 mmol/L 19   Calcium 8.9 - 10.3 mg/dL 9.9   Total Protein 6.5 - 8.1 g/dL 8.5   Total Bilirubin 0.0 - 1.2 mg/dL 2.6   Alkaline Phos 38 - 126 U/L 45   AST 15 - 41 U/L 21   ALT 0 - 44 U/L 25    Edinburgh Score:     No data to display         No data recorded  After visit meds:  Allergies as of 07/10/2024       Reactions   Penicillins Hives, Itching   Has patient had a PCN reaction causing immediate rash, facial/tongue/throat swelling, SOB or lightheadedness with hypotension: No Has patient had a PCN reaction  causing severe rash involving mucus membranes or skin necrosis: No Has patient had a PCN reaction that required hospitalization: No Has patient had a PCN reaction occurring within the last 10 years: Yes If all of the above answers are NO, then may proceed with Cephalosporin use.        Medication List     STOP taking these medications    cyclobenzaprine  10 MG tablet Commonly known as: FLEXERIL    metroNIDAZOLE  500 MG tablet Commonly known as: FLAGYL    ondansetron  8 MG disintegrating tablet Commonly known as: ZOFRAN -ODT   prochlorperazine  10 MG tablet Commonly known as: COMPAZINE    scopolamine  1 MG/3DAYS Commonly known as: TRANSDERM-SCOP   terconazole  0.4 % vaginal cream Commonly known as: Terazol 7    valACYclovir 500 MG tablet Commonly known as: VALTREX       TAKE these medications     ibuprofen  600 MG tablet Commonly known as: ADVIL  Take 1 tablet (600 mg total) by mouth every 6 (six) hours.   multivitamin-prenatal 27-0.8 MG Tabs tablet Take 1 tablet by mouth daily at 12 noon.   Prenatal Vitamins 0.8 MG tablet Take 1 tablet by mouth daily.         Discharge home in stable condition Infant Feeding: Breast Infant Disposition:home with mother Discharge instruction: per After Visit Summary and Postpartum booklet. Activity: Advance as tolerated. Pelvic rest for 6 weeks.  Diet: routine diet Future Appointments:No future appointments. Follow up Visit:  Follow-up Information     [facility]. Call in 6 week(s).   Specialty: Obstetrics and Gynecology Why: Please make a follow-up appointment your primary OB/GYN OFFICE in 5 to 6 weeks Contact information: [address] [place]  [phone]                Loreli Suzen BIRCH, CNM  P Ft Admin Pool * this pt had [facility] in [place], but has recently moved to [place]- can we offer her a PP visit?  Please schedule this patient for Postpartum visit in: 6 weeks with the following provider: Any provider In-Person For C/S patients schedule nurse incision check in weeks 2 weeks: no Low risk pregnancy complicated by: none Delivery mode:  SVD Anticipated Birth Control:  declined PP Procedures needed: none Schedule Integrated BH visit: no   07/10/2024 Jennifer M Ozan, DO    "

## 2024-07-08 NOTE — H&P (Signed)
 Tina Beck is a 29 y.o. female presenting for IOL for Oiligo and decreased fetal movement at 39 weeks. Patient receives prenatal care in [place] but recently moved to [facility]. Unable to see records in epic however patient has them on her phone. CNM briefly reviewed and normal pregnancy.   OB History     Gravida  4   Para  2   Term  2   Preterm      AB  1   Living  2      SAB  1   IAB      Ectopic      Multiple      Live Births  2          Past Medical History:  Diagnosis Date   Anxiety    Chlamydia    Depression    Herpes simplex    Past Surgical History:  Procedure Laterality Date   MOUTH SURGERY     OPEN REDUCTION INTERNAL FIXATION (ORIF) METACARPAL Right 03/23/2016   Procedure: OPEN REDUCTION INTERNAL FIXATION OF RIGHT 5TH METACARPAL;  Surgeon: Donnice Robinsons, MD;  Location: [facility];  Service: Orthopedics;  Laterality: Right;   Family History: family history is not on file. Social History:  reports that she has quit smoking. She has never used smokeless tobacco. She reports that she does not currently use alcohol. She reports that she does not currently use drugs after having used the following drugs: Marijuana.     Maternal Diabetes: No Genetic Screening: Normal Maternal Ultrasounds/Referrals: Normal Fetal Ultrasounds or other Referrals:  None Maternal Substance Abuse:  No Significant Maternal Medications:  Meds include: Other:  Significant Maternal Lab Results: Valtrex for HSV. Patient denies ever experiencing an outbreak.  Group B Strep negative Number of Prenatal Visits:greater than 3 verified prenatal visits Maternal Vaccinations:TDap Other Comments:  None    Review of Systems Maternal Medical History:  Reason for admission: Contractions.   Contractions: Perceived severity is mild.   Fetal activity: Perceived fetal activity is none.   Last perceived fetal movement was greater than 24 hours ago.   Prenatal  complications: Oligohydramnios.   Prenatal Complications - Diabetes: none.   Dilation: 4 Effacement (%): 70 Station: -2 Exam by:: [employer], RN Blood pressure (!) 93/56, pulse 77, temperature 98.4 F (36.9 C), temperature source Oral, resp. rate 18, height 5' 10 (1.778 m), weight 87 kg, last menstrual period 10/09/2023, SpO2 99%, unknown if currently breastfeeding. Maternal Exam:  Uterine Assessment: Contraction strength is mild.  Contraction frequency is irregular.  Abdomen: Patient reports no abdominal tenderness. Fetal presentation: vertex Introitus: Vulva is negative for lesion.  Ferning test: not done.  Nitrazine test: not done. Amniotic fluid character: not assessed. Pelvis: adequate for delivery.   Cervix: Cervix evaluated by digital exam.     Fetal Exam Fetal Monitor Review: Baseline rate: 135.  Variability: moderate (6-25 bpm).   Pattern: accelerations present and no decelerations.   Fetal State Assessment: Category I - tracings are normal.   Physical Exam Vitals and nursing note reviewed.  Constitutional:      General: She is not in acute distress.    Appearance: Normal appearance.  HENT:     Head: Normocephalic.  Pulmonary:     Effort: Pulmonary effort is normal.  Abdominal:     Tenderness: There is no abdominal tenderness.     Comments: Fetal movement present on palpation   Genitourinary: Vulva is no lesion.     Comments:  Dilation: 4 Effacement (%): 70 Station: -2 Presentation: Vertex Exam by:: [employer], RN  Musculoskeletal:     Cervical back: Normal range of motion.  Skin:    General: Skin is warm and dry.  Neurological:     Mental Status: She is alert and oriented to person, place, and time.  Psychiatric:        Mood and Affect: Mood normal.     Prenatal labs: ABO, Rh: --/--/B POS (01/03 0344) Antibody: NEG (01/03 0344) Rubella: Immune (06/25 0000) RPR: Nonreactive (06/25 0000)  HBsAg: Negative (06/25 0000)  HIV:  non-reactive (06/25 0000)  GBS: Negative/-- (12/18 0000)   Assessment/Plan: - IOL for DFM and Oligo at [redacted] weeks gestation.   Labor: plan for pit 2x2 and continue to titrate up as needed. Then assess for AROM.  FWB: Cat I  Pain: planning epidural  MOF: Breast GBS negative  MOC: Unsure  Circ: undecided  MOD: Hopeful for vaginal delivery.   Tina CHRISTELLA Cedar, MSN CNM  07/08/2024, 5:42 AM

## 2024-07-08 NOTE — Lactation Note (Signed)
 This note was copied from a baby's chart. Lactation Consultation Note  Patient Name: Tina Beck Unijb'd Date: 07/08/2024 Age:27 hours Reason for consult: Initial assessment;1st time breastfeeding;Term. P3, Per MOB, 1st time breastfeeding, MOB latched infant with pillow support on her right breast using the football hold position, infant was still breastfeeding after 14 minutes when LC left the room. MOB knows to call for further latch assistance if needed. MOB knows to breastfeed infant by cues, on demand, 8-12 times within 24 hours, skin to skin. LC discussed importance of maternal rest, meals and hydration. MOB informed LC infant had void and stool since birth. MOB was made aware of O/P services, breastfeeding support groups, community resources, and our phone # for post-discharge questions.    Maternal Data Has patient been taught Hand Expression?: Yes Does the patient have breastfeeding experience prior to this delivery?: No  Feeding Mother's Current Feeding Choice: Breast Milk and Formula  LATCH Score Latch: Grasps breast easily, tongue down, lips flanged, rhythmical sucking.  Audible Swallowing: A few with stimulation  Type of Nipple: Everted at rest and after stimulation  Comfort (Breast/Nipple): Soft / non-tender  Hold (Positioning): Assistance needed to correctly position infant at breast and maintain latch.  LATCH Score: 8   Lactation Tools Discussed/Used    Interventions Interventions: Breast feeding basics reviewed;Assisted with latch;Skin to skin;Hand express;Breast compression;Adjust position;Support pillows;Position options;DEBP;Education;CDC milk storage guidelines;CDC Guidelines for Breast Pump Cleaning;LC Services brochure;Guidelines for Milk Supply and Pumping Schedule Handout;Pace feeding  Discharge Pump: DEBP;Personal  Consult Status Consult Status: Follow-up Date: 07/09/24 Follow-up type: In-patient    [name] 07/08/2024, 11:07  PM

## 2024-07-08 NOTE — MAU Provider Note (Signed)
 " History     CSN: [id number]  Arrival date and time: 07/07/24 2152   Event Date/Time   First Provider Initiated Contact with Patient 07/08/24 0031      Chief Complaint  Patient presents with   Abdominal Pain   Contractions   Tina Beck , a  29 y.o. [id number] at [redacted]w[redacted]d presents to MAU as a transfer from [facility] ED for abdominal pain. Patient reports contractions but us  unable to discern how often of frequent they are occurring. She states that everything hurts Abdomen, back and pelvis and her legs. Currently rating pain as a 9/10. She denies vaginal bleeding or leaking of fluid. She reports decreased fetal movement and reports that she has not felt baby move since 07/06/24, but verbalized some occasional movement. Denies any attempts on trying to get baby to move.  Was recently seen in MAU with similar complaints.          OB History     Gravida  4   Para  2   Term  2   Preterm      AB  1   Living  2      SAB  1   IAB      Ectopic      Multiple      Live Births  2           Past Medical History:  Diagnosis Date   Anxiety    Chlamydia    Depression    Herpes simplex     Past Surgical History:  Procedure Laterality Date   MOUTH SURGERY     OPEN REDUCTION INTERNAL FIXATION (ORIF) METACARPAL Right 03/23/2016   Procedure: OPEN REDUCTION INTERNAL FIXATION OF RIGHT 5TH METACARPAL;  Surgeon: Donnice Robinsons, MD;  Location: [facility];  Service: Orthopedics;  Laterality: Right;    History reviewed. No pertinent family history.  Social History[1]  Allergies: Allergies[2]  Medications Prior to Admission  Medication Sig Dispense Refill Last Dose/Taking   Prenatal Vit-Fe Fumarate-FA (MULTIVITAMIN-PRENATAL) 27-0.8 MG TABS tablet Take 1 tablet by mouth daily at 12 noon.   07/07/2024   valACYclovir (VALTREX) 500 MG tablet Take 500 mg by mouth 2 (two) times daily.   07/07/2024   cyclobenzaprine  (FLEXERIL ) 10 MG tablet Take 1 tablet (10  mg total) by mouth 2 (two) times daily as needed for muscle spasms. (Patient not taking: Reported on 07/07/2024) 10 tablet 0    metroNIDAZOLE  (FLAGYL ) 500 MG tablet Take 1 tablet (500 mg total) by mouth 2 (two) times daily. (Patient not taking: Reported on 07/07/2024) 14 tablet 0    ondansetron  (ZOFRAN -ODT) 8 MG disintegrating tablet Take 1 tablet (8 mg total) by mouth every 8 (eight) hours as needed. (Patient not taking: Reported on 07/07/2024) 20 tablet 1    Prenatal Multivit-Min-Fe-FA (PRENATAL VITAMINS) 0.8 MG tablet Take 1 tablet by mouth daily. 30 tablet 0    prochlorperazine  (COMPAZINE ) 10 MG tablet Take 1 tablet (10 mg total) by mouth every 6 (six) hours as needed for nausea or vomiting. (Patient not taking: Reported on 07/07/2024) 30 tablet 0    scopolamine  (TRANSDERM-SCOP) 1 MG/3DAYS Place 1 patch (1.5 mg total) onto the skin every 3 (three) days. (Patient not taking: Reported on 07/07/2024) 10 patch 12    terconazole  (TERAZOL 7 ) 0.4 % vaginal cream Place 1 applicator vaginally at bedtime. (Patient not taking: Reported on 07/07/2024) 45 g 0     Review of Systems  Constitutional:  Negative for  chills, fatigue and fever.  Eyes:  Negative for pain and visual disturbance.  Respiratory:  Negative for apnea, shortness of breath and wheezing.   Cardiovascular:  Negative for chest pain and palpitations.  Gastrointestinal:  Positive for abdominal pain. Negative for constipation, diarrhea, nausea and vomiting.  Genitourinary:  Positive for pelvic pain. Negative for difficulty urinating, dysuria, vaginal bleeding, vaginal discharge and vaginal pain.  Musculoskeletal:  Positive for back pain.  Neurological:  Negative for seizures, weakness and headaches.  Psychiatric/Behavioral:  Negative for suicidal ideas.    Physical Exam   Blood pressure 124/74, pulse 66, temperature 98.2 F (36.8 C), resp. rate 16, height 5' 10 (1.778 m), weight 87 kg, last menstrual period 10/09/2023, SpO2 99%, unknown if currently  breastfeeding.  Physical Exam Vitals and nursing note reviewed.  Constitutional:      General: She is not in acute distress.    Appearance: Normal appearance.  HENT:     Head: Normocephalic.  Pulmonary:     Effort: Pulmonary effort is normal.  Abdominal:     Palpations: Abdomen is soft.     Tenderness: There is no abdominal tenderness.     Comments: Gravid uterus. Fetal movement palpated on exam.   Musculoskeletal:     Cervical back: Normal range of motion.  Skin:    General: Skin is warm and dry.  Neurological:     Mental Status: She is alert and oriented to person, place, and time.  Psychiatric:        Mood and Affect: Mood normal.    FHT: 155 bpm with moderate variability 15x15 accels no decels  Toco: Some UI  with occasional contractions.   Dilation: 4 Effacement (%): 70 Station: -2 Presentation: Vertex Exam by:: Lum Epley, RN   MAU Course  Procedures Orders Placed This Encounter  Procedures   US  MFM Fetal BPP Wo Non Stress   EKG   EKG   Patient was provided a clicker.   MDM - Cervix unchanged after reassessment. Low suspicion for Labor at this time.  -Patient only hit fetal clicker 1 time in MAU, but patient endorses not paying attention. Fetal movement felt by provider.  BPP ordered given length of complaint. - PreLim US  results reviewed. Overall small amount of fluid on US  with AFI low at 4.8. Given DFM and Oligohydramnios at 39 weeks. Recommend for IOL.  - Reviewed recommendation with patient and patient agreeable to plan of care.  - admit orders place.  - Due to patient acuity on L&D, plan to be holding in MAU until a bed becomes available.  - Spoke with L&D team and agreeable to plan of care.   Assessment and Plan  - Admit to labor for oligo and DFM at term.  - Labor admit orders placed.   Tina CHRISTELLA Cedar, MSN CNM  07/08/2024, 12:31 AM      [1]  Social History Tobacco Use   Smoking status: Former   Smokeless tobacco: Never  Vaping  Use   Vaping status: Never Used  Substance Use Topics   Alcohol use: Not Currently   Drug use: Not Currently    Types: Marijuana  [2]  Allergies Allergen Reactions   Penicillins Hives and Itching    Has patient had a PCN reaction causing immediate rash, facial/tongue/throat swelling, SOB or lightheadedness with hypotension: No Has patient had a PCN reaction causing severe rash involving mucus membranes or skin necrosis: No Has patient had a PCN reaction that required hospitalization: No Has patient had  a PCN reaction occurring within the last 10 years: Yes If all of the above answers are NO, then may proceed with Cephalosporin use.    "

## 2024-07-08 NOTE — Progress Notes (Signed)
" ° °  29 y.o. [id number] [redacted]w[redacted]d     Patient Active Problem List   Diagnosis Date Noted   Pregnancy 07/08/2024   Herpesviral infection, unspecified 01/17/2024   Chlamydia 03/22/2017   Missed abortion 10/14/2015    [name] is admitted for IOL for Oligo and decreased fetal movement at 39 weeks.    Subjective:  Patient resting comfortably, denies feeling contractions at this time. Desires an epidural once she becomes uncomfortable.   Objective:   Vitals:   07/07/24 2357 07/08/24 0409 07/08/24 0740 07/08/24 0743  BP: 124/74 (!) 93/56  112/65  Pulse: 66 77  70  Resp: 16 18 18    Temp: 98.2 F (36.8 C) 98.4 F (36.9 C) 98.2 F (36.8 C)   TempSrc:  Oral Oral   SpO2: 99%     Weight:      Height:        Current Vital Signs 24h Vital Sign Ranges  T 98.2 F (36.8 C) Temp  Avg: 98.2 F (36.8 C)  Min: 98 F (36.7 C)  Max: 98.4 F (36.9 C)  BP 112/65 BP  Min: 93/56  Max: 124/74  HR 70 Pulse  Avg: 74.7  Min: 66  Max: 83  RR 18 Resp  Avg: 17.5  Min: 16  Max: 18  SaO2 99 % Room Air SpO2  Avg: 97.7 %  Min: 96 %  Max: 99 %       24 Hour I/O Current Shift I/O  Time Ins Outs No intake/output data recorded. No intake/output data recorded.   FHR: 130 baseline, Moderate variability, 15x15 accels, No decels.  Toco: Irritability SVE: 4/70/-2 by Alan Prosper, RN on 07/08/24 at 0911   Assessment & Plan:  SNM to bedside to discuss POC with patient. Patient not contracting at this time. Patient desires to get an epidural once she begins having contractions. Plan to continue titration of Pitocin  to achieve an adequate pattern.    FHT CAT 1 GBS: Negative Pitocin  2u since 07/08/24 at 0941 Analgesia: Desires epidural   Monico Caprice, SNM 07/08/2024 10:33 AM   "

## 2024-07-09 LAB — CBC
HCT: 30.1 % — ABNORMAL LOW (ref 36.0–46.0)
Hemoglobin: 10.2 g/dL — ABNORMAL LOW (ref 12.0–15.0)
MCH: 30.6 pg (ref 26.0–34.0)
MCHC: 33.9 g/dL (ref 30.0–36.0)
MCV: 90.4 fL (ref 80.0–100.0)
Platelets: 229 K/uL (ref 150–400)
RBC: 3.33 MIL/uL — ABNORMAL LOW (ref 3.87–5.11)
RDW: 12.8 % (ref 11.5–15.5)
WBC: 9.9 K/uL (ref 4.0–10.5)
nRBC: 0 % (ref 0.0–0.2)

## 2024-07-09 NOTE — Clinical Social Work Maternal (Signed)
 CLINICAL SOCIAL WORK MATERNAL/CHILD NOTE  Patient Details  Name: Tina Beck MRN: [id number] Date of Birth: 07/19/1995  Date:  07/09/2024  Clinical Social Worker Initiating Note:  Sharyne Roulette, LCSWA Date/Time: Initiated:  07/09/24/1641     Child's Name:  Tina Beck   Biological Parents:  Mother   Need for Interpreter:  None   Reason for Referral:  Behavioral Health Concerns, Current Substance Use/Substance Use During Pregnancy     Address:  [address] [place] [address]    Phone number:  [phone] (home)     Additional phone number:   Household Members/Support Persons (HM/SP):   Household Member/Support Person 1, Household Member/Support Person 2, Household Member/Support Person 3   HM/SP Name Relationship DOB or Age  HM/SP -1 Eleanor Samuel Mother    HM/SP -2 Shyrl Sprague Son 11/07/2017  HM/SP -3 A'zhar Gasper Son 11/18/2018  HM/SP -4        HM/SP -5        HM/SP -6        HM/SP -7        HM/SP -8          Natural Supports (not living in the home):  Immediate Family   Professional Supports: None   Employment: Unemployed   Type of Work:     Education:  Other (comment) (GED)   Homebound arranged:    Financial Resources:  Medicaid   Other Resources:      Cultural/Religious Considerations Which May Impact Care:  Per [employer], she identifies as Curator  Strengths:  Ability to meet basic needs  , Home prepared for child  , Pediatrician chosen   Psychotropic Medications:         Pediatrician:     ([facility]  [place]  Pediatrician List:   [facility] [place]      Pediatrician Fax Number:    Risk Factors/Current Problems:  Substance Use  , Mental Health Concerns     Cognitive State:  Able to Concentrate  , Alert  , Goal Oriented  , Linear Thinking     Mood/Affect:  Interested  , Relaxed  ,  Bright  , Calm     CSW Assessment: CSW was consulted due to [facility] use during pregnancy and history of anxiety and depression. CSW met with MOB at bedside to complete assessment. When CSW entered room, MOB was observed laying in hospital bed holding infant. CSW introduced self and explained reason for consult. MOB presented as calm, was agreeable to consult and remained engaged throughout encounter.   MOB confirmed demographic information in chart. MOB reports she lives with her mother and two children. MOB declined to share FOB's information. MOB identified her brother as an additional support.   CSW inquired about MOB's mental health history. MOB reports she has not been formally diagnosed with depression or anxiety but reports endorsing symptoms of depression and anxiety during pregnancy, which she felt improved over time. MOB recalls crying a lot in the beginning of her pregnancy. MOB denied a history of postpartum depression following the birth of her other 2 children. CSW assessed for safety. MOB denied current SI/HI/domestic violence.   CSW provided education regarding the baby blues period vs. perinatal mood disorders, discussed treatment and gave resources for mental health follow up if concerns arise.  CSW recommends self-evaluation during  the postpartum time period using the New Mom Checklist from Postpartum Progress and encouraged MOB to contact a medical professional if symptoms are noted at any time.    MOB reports she has all needed items for infant, including a car seat and bassinet. MOB denied issues with transportation.  CSW informed MOB about hospital drug screen policy due to +UDS for [facility] 03/19/2024. CSW explained that infant's UDS and CDS would be monitored and a CPS report would be made if warranted. MOB expressed understanding. CSW inquired about substance use during pregnancy. MOB reports she smoked marijuana to have an appetite during pregnancy and reports her last use was 9/25. MOB  denied prior CPS involvement.   CSW provided review of Sudden Infant Death Syndrome (SIDS) precautions.    CSW identifies no further need for intervention and no barriers to discharge at this time.  CSW Plan/Description:  No Further Intervention Required/No Barriers to Discharge, Sudden Infant Death Syndrome (SIDS) Education, Perinatal Mood and Anxiety Disorder (PMADs) Education, Hospital Drug Screen Policy Information, CSW Will Continue to Monitor Umbilical Cord Tissue Drug Screen Results and Make Report if Warranted    Najai Waszak K Jesselee Poth, LCSWA 07/09/2024, 4:47 PM

## 2024-07-09 NOTE — Progress Notes (Signed)
 Post Partum Day 1 Subjective: no complaints, up ad lib, and voiding  Objective: Blood pressure 114/79, pulse 74, temperature 98.3 F (36.8 C), temperature source Oral, resp. rate 18, height 5' 10 (1.778 m), weight 87 kg, last menstrual period 10/09/2023, SpO2 100%, unknown if currently breastfeeding.  Physical Exam:  General: alert, cooperative, and appears stated age Lochia: appropriate Uterine Fundus: firm DVT Evaluation: No evidence of DVT seen on physical exam.  Recent Labs    07/08/24 0320 07/09/24 0456  HGB 12.2 10.2*  HCT 37.4 30.1*    Assessment/Plan: Plan for discharge tomorrow, Breastfeeding, and Contraception unsure   LOS: 1 day   Tina GORMAN Birk, MD 07/09/2024, 7:59 AM

## 2024-07-10 ENCOUNTER — Other Ambulatory Visit (HOSPITAL_COMMUNITY): Payer: Self-pay

## 2024-07-10 MED ORDER — IBUPROFEN 600 MG PO TABS
600.0000 mg | ORAL_TABLET | Freq: Four times a day (QID) | ORAL | 0 refills | Status: AC
  Filled 2024-07-10: qty 30, 8d supply, fill #0

## 2024-07-10 NOTE — Patient Instructions (Signed)

## 2024-07-10 NOTE — Lactation Note (Signed)
 This note was copied from a baby's chart. Lactation Consultation Note  Patient Name: Tina Beck Unijb'd Date: 07/10/2024 Age:27 hours Reason for consult: Follow-up assessment  P3. Mom stated she was good and didn't need any assistance w/BF or feeding baby.  Mom stated she has no questions or concerns at this time. Asked mom did she know about engorgement management, mom stated yes. She has all information and will be ready for d/c home tomorrow. Didn't need to see Lactation again. Maternal Data    Feeding    LATCH Score                    Lactation Tools Discussed/Used    Interventions    Discharge    Consult Status Consult Status: Complete    Trentan Trippe G 07/10/2024, 12:34 AM

## 2024-07-15 ENCOUNTER — Telehealth (HOSPITAL_COMMUNITY): Payer: Self-pay

## 2024-07-15 NOTE — Telephone Encounter (Signed)
 07/15/2024 1902  Name: Tina Beck MRN: [id number] DOB: 05/25/1996  Reason for Call:  Transition of Care Hospital Discharge Call  Contact Status: Patient Contact Status: Complete  Language assistant needed:          Follow-Up Questions: Do You Have Any Concerns About Your Health As You Heal From Delivery?: No Do You Have Any Concerns About Your Infants Health?: No  Edinburgh Postnatal Depression Scale:  In the Past 7 Days: I have been able to laugh and see the funny side of things.: As much as I always could I have looked forward with enjoyment to things.: As much as I ever did I have blamed myself unnecessarily when things went wrong.: No, never I have been anxious or worried for no good reason.: No, not at all I have felt scared or panicky for no good reason.: No, not at all Things have been getting on top of me.: No, I have been coping as well as ever I have been so unhappy that I have had difficulty sleeping.: Not at all I have felt sad or miserable.: No, not at all I have been so unhappy that I have been crying.: No, never The thought of harming myself has occurred to me.: Never Edinburgh Postnatal Depression Scale Total: 0  PHQ2-9 Depression Scale:     Discharge Follow-up: Edinburgh score requires follow up?: No  Post-discharge interventions: Reviewed Newborn Safe Sleep Practices  Signature  Rosaline Deretha PEAK

## 2024-08-10 ENCOUNTER — Ambulatory Visit: Admitting: Advanced Practice Midwife
